# Patient Record
Sex: Male | Born: 1985 | Hispanic: Yes | Smoking: Never smoker
Health system: Southern US, Community
[De-identification: ages and names within clinical notes are randomized; demographics above are authoritative.]

## PROBLEM LIST (undated history)

## (undated) DIAGNOSIS — T7840XA Allergy, unspecified, initial encounter: Secondary | ICD-10-CM

## (undated) DIAGNOSIS — E785 Hyperlipidemia, unspecified: Secondary | ICD-10-CM

## (undated) DIAGNOSIS — J45909 Unspecified asthma, uncomplicated: Secondary | ICD-10-CM

## (undated) HISTORY — DX: Allergy, unspecified, initial encounter: T78.40XA

## (undated) HISTORY — DX: Hyperlipidemia, unspecified: E78.5

---

## 2009-07-15 ENCOUNTER — Ambulatory Visit: Payer: Self-pay | Admitting: Family Medicine

## 2011-09-24 ENCOUNTER — Emergency Department (HOSPITAL_COMMUNITY)
Admission: EM | Admit: 2011-09-24 | Discharge: 2011-09-24 | Disposition: A | Discharge status: Home / Self Care | Payer: Self-pay | Attending: Emergency Medicine | Admitting: Emergency Medicine

## 2011-09-24 ENCOUNTER — Encounter (HOSPITAL_COMMUNITY): Payer: Self-pay | Admitting: Emergency Medicine

## 2011-09-24 DIAGNOSIS — T7840XA Allergy, unspecified, initial encounter: Secondary | ICD-10-CM

## 2011-09-24 DIAGNOSIS — R21 Rash and other nonspecific skin eruption: Secondary | ICD-10-CM | POA: Insufficient documentation

## 2011-09-24 DIAGNOSIS — R062 Wheezing: Secondary | ICD-10-CM | POA: Insufficient documentation

## 2011-09-24 DIAGNOSIS — T50995A Adverse effect of other drugs, medicaments and biological substances, initial encounter: Secondary | ICD-10-CM | POA: Insufficient documentation

## 2011-09-24 HISTORY — DX: Unspecified asthma, uncomplicated: J45.909

## 2011-09-24 MED ORDER — DIPHENHYDRAMINE HCL 25 MG PO CAPS: 50.0000 mg | ORAL_CAPSULE | Freq: Once | ORAL | Status: DC

## 2011-09-24 MED ORDER — EPINEPHRINE 0.3 MG/0.3ML IJ DEVI: INTRAMUSCULAR | Status: DC

## 2011-09-24 MED ORDER — PREDNISONE 20 MG PO TABS
ORAL_TABLET | ORAL | Status: AC
  Administered 2011-09-24: 60 mg via ORAL
  Filled 2011-09-24: qty 3

## 2011-09-24 MED ORDER — DIPHENHYDRAMINE HCL 25 MG PO CAPS
50.0000 mg | ORAL_CAPSULE | Freq: Once | ORAL | Status: AC
  Administered 2011-09-24: 50 mg via ORAL

## 2011-09-24 MED ORDER — PREDNISONE 20 MG PO TABS: 40.0000 mg | ORAL_TABLET | Freq: Every day | ORAL | Status: AC

## 2011-09-24 MED ORDER — PREDNISONE 20 MG PO TABS: 60.0000 mg | ORAL_TABLET | Freq: Once | ORAL | Status: DC

## 2011-09-24 MED ORDER — LIDOCAINE VISCOUS 2 % MT SOLN: 20.0000 mL | OROMUCOSAL | Status: AC | PRN

## 2011-09-24 MED ORDER — DIPHENHYDRAMINE HCL 25 MG PO CAPS
ORAL_CAPSULE | ORAL | Status: AC
  Administered 2011-09-24: 50 mg via ORAL
  Filled 2011-09-24: qty 2

## 2011-09-24 MED ORDER — BENADRYL 25 MG PO TABS: 25.0000 mg | ORAL_TABLET | Freq: Four times a day (QID) | ORAL | Status: DC | PRN

## 2011-09-24 NOTE — ED Provider Notes (Signed)
History     CSN: [id number]  Arrival date & time 09/24/11  4098   First MD Initiated Contact with Patient 09/24/11 1942      Chief Complaint  Patient presents with  . Allergic Reaction    (Consider location/radiation/quality/duration/timing/severity/associated sxs/prior treatment) Patient is a 26 y.o. male presenting with allergic reaction. The history is provided by the patient. No language interpreter was used.  Allergic Reaction The primary symptoms are  wheezing and rash. The primary symptoms do not include shortness of breath, nausea, vomiting, dizziness or angioedema. The current episode started less than 1 hour ago. The problem has been gradually improving.  The rash is associated with itching.  The onset of the reaction was associated with a change in medication (alkasezer cold ? with asa). Significant symptoms also include rhinorrhea and itching.   Patient took alka seltzer cold pta and had an allergic reaction.  Pt is allergic to asa.   Wheezing with red raised rash to trunk.    Feels like throat is closing.  Able to swallow.  Congested x 1 week.  Ulcerations noted to uvula and back of throat.  Used his albuterol inhaler and wheezing has resolved.  Itching.   Past Medical History  Diagnosis Date  . Asthma     History reviewed. No pertinent past surgical history.  History reviewed. No pertinent family history.  History  Substance Use Topics  . Smoking status: Never Smoker   . Smokeless tobacco: Not on file  . Alcohol Use: Yes     3-4 times per week one or two drinks      Review of Systems  Constitutional: Negative.  Negative for fever.  HENT: Positive for congestion and rhinorrhea.   Eyes: Negative.   Respiratory: Positive for wheezing. Negative for shortness of breath.   Cardiovascular: Negative.  Negative for chest pain.  Gastrointestinal: Negative.  Negative for nausea and vomiting.  Skin: Positive for itching and rash.  Neurological: Negative.  Negative  for dizziness, weakness and headaches.  Psychiatric/Behavioral: Negative.   All other systems reviewed and are negative.    Allergies  Aspirin  Home Medications   Current Outpatient Rx  Name Route Sig Dispense Refill  . IPRATROPIUM-ALBUTEROL 18-103 MCG/ACT IN AERO Inhalation Inhale 2 puffs into the lungs every 6 (six) hours as needed. Shortness of breath      BP 109/68  Pulse 85  Temp 98 F (36.7 C) (Oral)  Resp 18  SpO2 99%  Physical Exam  Nursing note and vitals reviewed. Constitutional: He is oriented to person, place, and time. He appears well-developed and well-nourished.  HENT:  Head: Normocephalic.  Eyes: Conjunctivae and EOM are normal. Pupils are equal, round, and reactive to light.  Neck: Normal range of motion. Neck supple.  Cardiovascular: Normal rate.   Pulmonary/Chest: Effort normal.  Abdominal: Soft.  Musculoskeletal: Normal range of motion.  Neurological: He is alert and oriented to person, place, and time.  Skin: Skin is warm and dry. Rash noted.       Red raised macular rash to trunk  Psychiatric: He has a normal mood and affect.    ED Course  Procedures (including critical care time)  Labs Reviewed - No data to display No results found.   No diagnosis found.    MDM  Allergic reaction to alka  selzer cold and cough.  Wheezing and rash resolved in ER after inhaler, prednisone and benadryl.  Continue prednisone and benadryl/inhaler.  Return if worse.  Remi Haggard, NP 09/26/11 2001

## 2011-09-24 NOTE — ED Notes (Signed)
Per pt: sore throat and  Congestion for one week with ulceration noted to uvula and throat. Pt took Alkalizer Cold and Cough and now has facial swelling, throat swelling and diffuse itching. Pt took a cough drop, and Albuterol inhaler. Symptoms have improved since and pt no longer feels constriction in throat. No oral swelling noted. NKDA. PMH: asthma

## 2011-09-24 NOTE — ED Notes (Signed)
NP at bedside.

## 2011-09-24 NOTE — ED Notes (Signed)
Pt verbalizes understanding 

## 2011-09-28 NOTE — ED Provider Notes (Signed)
History/physical exam/procedure(s) were performed by non-physician practitioner and as supervising physician I was immediately available for consultation/collaboration. I have reviewed all notes and am in agreement with care and plan.   Hilario Quarry, MD 09/28/11 [phone]

## 2019-10-31 ENCOUNTER — Ambulatory Visit: Payer: 59 | Admitting: Medical

## 2019-10-31 ENCOUNTER — Other Ambulatory Visit: Payer: Self-pay

## 2019-10-31 ENCOUNTER — Encounter: Payer: Self-pay | Admitting: Medical

## 2019-10-31 VITALS — BP 102/80 | HR 76 | Ht 69.0 in | Wt 169.6 lb

## 2019-10-31 DIAGNOSIS — M6289 Other specified disorders of muscle: Secondary | ICD-10-CM | POA: Insufficient documentation

## 2019-10-31 DIAGNOSIS — M5441 Lumbago with sciatica, right side: Secondary | ICD-10-CM

## 2019-10-31 DIAGNOSIS — G479 Sleep disorder, unspecified: Secondary | ICD-10-CM | POA: Insufficient documentation

## 2019-10-31 DIAGNOSIS — H938X3 Other specified disorders of ear, bilateral: Secondary | ICD-10-CM

## 2019-10-31 DIAGNOSIS — M5442 Lumbago with sciatica, left side: Secondary | ICD-10-CM

## 2019-10-31 DIAGNOSIS — N5082 Scrotal pain: Secondary | ICD-10-CM

## 2019-10-31 DIAGNOSIS — J4530 Mild persistent asthma, uncomplicated: Secondary | ICD-10-CM

## 2019-10-31 DIAGNOSIS — G8929 Other chronic pain: Secondary | ICD-10-CM

## 2019-10-31 DIAGNOSIS — G4700 Insomnia, unspecified: Secondary | ICD-10-CM | POA: Insufficient documentation

## 2019-10-31 HISTORY — DX: Lumbago with sciatica, left side: M54.42

## 2019-10-31 HISTORY — DX: Other chronic pain: G89.29

## 2019-10-31 LAB — POCT URINALYSIS DIP (PROADVANTAGE DEVICE)
Bilirubin, UA: NEGATIVE
Blood, UA: NEGATIVE
Glucose, UA: NEGATIVE mg/dL
Ketones, POC UA: NEGATIVE mg/dL
Leukocytes, UA: NEGATIVE
Nitrite, UA: NEGATIVE
Protein Ur, POC: NEGATIVE mg/dL
Specific Gravity, Urine: 1.025
Urobilinogen, Ur: NEGATIVE
pH, UA: 6 (ref 5.0–8.0)

## 2019-10-31 MED ORDER — ALBUTEROL SULFATE HFA 108 (90 BASE) MCG/ACT IN AERS: 2.0000 | INHALATION_SPRAY | Freq: Four times a day (QID) | RESPIRATORY_TRACT | 1 refills | Status: DC | PRN

## 2019-10-31 NOTE — Progress Notes (Signed)
Subjective: Chief Complaint  Patient presents with  . New Patient (Initial Visit)    get established   . Anxiety    can't sleep without benadryl  . Back Pain    lower back-mostly left sdie possibly tied to scrotum   . Referral    ENT for ear problem (right)  . Hernia   Has insurance now, needs PCP.   accompanied by fiance.  Has had asthma all life, needs refill on inhaler.   Has asthma triggers year round, dust, pet dander, bursts of exercise.  Doesn't always get flare up with exercise.  Lately uses inhaler weekly once per week, some months doesn't have to use the inhaler, but uses often enough.  Sometimes uses with exercise  Currently is taking benadryl nightly more for sleep but also sometime for allergies. Has been on preventative in remote past and ddidn't think it helped.   Has dogs.  Works around some dust, Air traffic controller.   Doesn't smoke.  Has sleep problems.   Uses benadryl sometimes, sometimes melatonin.   Has rest less sleep.  Has trouble getting to sleep initially.   Attributes to anxiety or restlessness.  Tries to read to get to sleep.  Tries stretching.  This has been a problem over a year.    Works full time and has part time work.  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  Use to be worse in 20s, had shingles one time with stress.  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  Got sick often, had sores in mouth.  No prior counseling.   No prior medication.  Has tried clonopin before from a friend.  That helped chilled him out.  Engaged, has baby on the way.   Been together 1.5 years with fiance.   Biggest stressor is finances.    Has low back pain, pain in scrotum.   Has soreness in testicles, pulsing type pain.   Has had pain for few years.  He also notes low back pain, tightness in his legs and the IT band and hamstrings.  He tries to do some stretches.  He does exercise.  Pain in scrotum has been going on for more than a year.  Denies blood in urine,  no dysuria, no urinary frequency.  No numbness or tingling down the legs.  No fall.  No incontinence.  No fever.  He notes in the past 2 weeks he has had some pressure in his ears.  He notes some ringing.  He wondered about water down in the ears or wax.  He tried some home wax remedy.  Past Medical History:  Diagnosis Date  . Asthma     Current Outpatient Medications on File Prior to Visit  Medication Sig Dispense Refill  . BENADRYL 25 MG tablet Take 1 tablet (25 mg total) by mouth every 6 (six) hours as needed for itching. 20 tablet 0   No current facility-administered medications on file prior to visit.   ROS as in subjective    Objective: BP 102/80   Pulse 76   Ht 5\' 9"  (1.753 m)   Wt 169 lb 9.6 oz (76.9 kg)   SpO2 97%   BMI 25.05 kg/m   General appearence: alert, no distress, WD/WN, white male HEENT: normocephalic, sclerae anicteric, PERRLA, EOMi, TMs pearly, canals normal Heart: RRR, normal S1, S2, no murmurs Lungs: CTA bilaterally, no wheezes, rhonchi, or rales Back: non tender, full ROM  Musculoskeletal: Bilateral IT  bands and hamstrings seem tight, legs nontender, but hip range of motion and leg extension seems a little decreased due to the leg tightness, otherwise legs nontender, no swelling, no obvious deformity Extremities: no edema, no cyanosis, no clubbing Pulses: 2+ symmetric, upper and lower extremities, normal cap refill Neurological: Legs neurovascularly intact  psychiatric: normal affect, behavior normal, pleasant    Assessment: Encounter Diagnoses  Name Primary?  . Mild persistent asthma without complication Yes  . Scrotal pain   . Chronic bilateral low back pain with bilateral sciatica   . Hamstring tightness   . Sleep disturbance   . Insomnia, unspecified type   . Pressure sensation in both ears      Plan: Asthma-continue albuterol as needed, discussed prevention of flareups, discussed allergy medicine and preventative inhalers if he has  more moderate asthma symptoms.  For now he will continue albuterol as needed  Back pain, scrotal pain-etiology unclear but likely lumbar radicular issue.  We will send for baseline x-ray.  We discussed importance of testicular cancer screening but no obvious nodule today.  Urinalysis reviewed.  Continue stretching.  We will likely refer to physical therapy given the leg hamstring and IT band tightness.  Sleep disturbance, insomnia-likely triggered by stress, discussed sleep hygiene, stress reduction, consider counseling, consider journaling, can use melatonin or Benadryl as needed.  Pressure sensation ears-advised 5 days of Sudafed over-the-counter and if this does not help, then follow-up to recheck   David Cobb was seen today for new patient (initial visit), anxiety, back pain, referral and hernia.  Diagnoses and all orders for this visit:  Mild persistent asthma without complication  Scrotal pain -     DG Lumbar Spine Complete; Future -     POCT Urinalysis DIP (Proadvantage Device)  Chronic bilateral low back pain with bilateral sciatica -     DG Lumbar Spine Complete; Future  Hamstring tightness -     DG Lumbar Spine Complete; Future  Sleep disturbance  Insomnia, unspecified type  Pressure sensation in both ears  Other orders -     albuterol (VENTOLIN HFA) 108 (90 Base) MCG/ACT inhaler; Inhale 2 puffs into the lungs every 6 (six) hours as needed for wheezing or shortness of breath.  Follow-up soon for physical and fasting labs, PFT

## 2019-10-31 NOTE — Patient Instructions (Signed)
Please go to [facility] for your back xray.   Their hours are 8am - 4:30 pm Monday - Friday.  Take your insurance card with you.  [facility] [id number]  [address], [address] [place], [place] [address]  [address] [place], [place] [address]     We will likely refer you for physical therapy      Insomnia Insomnia is a sleep disorder that makes it difficult to fall asleep or stay asleep. Insomnia can cause fatigue, low energy, difficulty concentrating, mood swings, and poor performance at work or school. There are three different ways to classify insomnia:  Difficulty falling asleep.  Difficulty staying asleep.  Waking up too early in the morning. Any type of insomnia can be long-term (chronic) or short-term (acute). Both are common. Short-term insomnia usually lasts for three months or less. Chronic insomnia occurs at least three times a week for longer than three months. What are the causes? Insomnia may be caused by another condition, situation, or substance, such as:  Anxiety.  Certain medicines.  Gastroesophageal reflux disease (GERD) or other gastrointestinal conditions.  Asthma or other breathing conditions.  Restless legs syndrome, sleep apnea, or other sleep disorders.  Chronic pain.  Menopause.  Stroke.  Abuse of alcohol, tobacco, or illegal drugs.  Mental health conditions, such as depression.  Caffeine.  Neurological disorders, such as Alzheimer's disease.  An overactive thyroid (hyperthyroidism). Sometimes, the cause of insomnia may not be known. What increases the risk? Risk factors for insomnia include:  Gender. Women are affected more often than men.  Age. Insomnia is more common as you get older.  Stress.  Lack of exercise.  Irregular work schedule or working night shifts.  Traveling between different time zones.  Certain medical and mental health conditions. What are the signs or symptoms? If you have  insomnia, the main symptom is having trouble falling asleep or having trouble staying asleep. This may lead to other symptoms, such as:  Feeling fatigued or having low energy.  Feeling nervous about going to sleep.  Not feeling rested in the morning.  Having trouble concentrating.  Feeling irritable, anxious, or depressed. How is this diagnosed? This condition may be diagnosed based on:  Your symptoms and medical history. Your health care provider may ask about: ? Your sleep habits. ? Any medical conditions you have. ? Your mental health.  A physical exam. How is this treated? Treatment for insomnia depends on the cause. Treatment may focus on treating an underlying condition that is causing insomnia. Treatment may also include:  Medicines to help you sleep.  Counseling or therapy.  Lifestyle adjustments to help you sleep better. Follow these instructions at home: Eating and drinking   Limit or avoid alcohol, caffeinated beverages, and cigarettes, especially close to bedtime. These can disrupt your sleep.  Do not eat a large meal or eat spicy foods right before bedtime. This can lead to digestive discomfort that can make it hard for you to sleep. Sleep habits   Keep a sleep diary to help you and your health care provider figure out what could be causing your insomnia. Write down: ? When you sleep. ? When you wake up during the night. ? How well you sleep. ? How rested you feel the next day. ? Any side effects of medicines you are taking. ? What you eat and drink.  Make your bedroom a dark, comfortable place where it is easy to fall asleep. ? Put up shades or blackout  curtains to block light from outside. ? Use a white noise machine to block noise. ? Keep the temperature cool.  Limit screen use before bedtime. This includes: ? Watching TV. ? Using your smartphone, tablet, or computer.  Stick to a routine that includes going to bed and waking up at the same times  every day and night. This can help you fall asleep faster. Consider making a quiet activity, such as reading, part of your nighttime routine.  Try to avoid taking naps during the day so that you sleep better at night.  Get out of bed if you are still awake after 15 minutes of trying to sleep. Keep the lights down, but try reading or doing a quiet activity. When you feel sleepy, go back to bed. General instructions  Take over-the-counter and prescription medicines only as told by your health care provider.  Exercise regularly, as told by your health care provider. Avoid exercise starting several hours before bedtime.  Use relaxation techniques to manage stress. Ask your health care provider to suggest some techniques that may work well for you. These may include: ? Breathing exercises. ? Routines to release muscle tension. ? Visualizing peaceful scenes.  Make sure that you drive carefully. Avoid driving if you feel very sleepy.  Keep all follow-up visits as told by your health care provider. This is important. Contact a health care provider if:  You are tired throughout the day.  You have trouble in your daily routine due to sleepiness.  You continue to have sleep problems, or your sleep problems get worse. Get help right away if:  You have serious thoughts about hurting yourself or someone else. If you ever feel like you may hurt yourself or others, or have thoughts about taking your own life, get help right away. You can go to your nearest emergency department or call:  Your local emergency services (911 in the [place]  A suicide crisis helpline, such as the National Suicide Prevention Lifeline at 7865440831. This is open 24 hours a day. Summary  Insomnia is a sleep disorder that makes it difficult to fall asleep or stay asleep.  Insomnia can be long-term (chronic) or short-term (acute).  Treatment for insomnia depends on the cause. Treatment may focus on treating an  underlying condition that is causing insomnia.  Keep a sleep diary to help you and your health care provider figure out what could be causing your insomnia. This information is not intended to replace advice given to you by your health care provider. Make sure you discuss any questions you have with your health care provider. Document Revised: 02/03/2017 Document Reviewed: 12/01/2016 Elsevier Patient Education  2020 [employer].

## 2019-11-06 HISTORY — PX: NO PAST SURGERIES: SHX2092

## 2019-11-12 ENCOUNTER — Ambulatory Visit
Admission: RE | Admit: 2019-11-12 | Discharge: 2019-11-12 | Disposition: A | Discharge status: Home / Self Care | Payer: 59 | Source: Ambulatory Visit | Attending: Medical | Admitting: Medical

## 2019-11-12 ENCOUNTER — Telehealth: Payer: Self-pay | Admitting: Medical

## 2019-11-12 DIAGNOSIS — N5082 Scrotal pain: Secondary | ICD-10-CM

## 2019-11-12 DIAGNOSIS — M6289 Other specified disorders of muscle: Secondary | ICD-10-CM

## 2019-11-12 DIAGNOSIS — M5442 Lumbago with sciatica, left side: Secondary | ICD-10-CM

## 2019-11-12 NOTE — Telephone Encounter (Signed)
Pt dropped off form to be signed for insurance. Please call when ready  [phone]  He needs to send back to his insurance  Form being sent back in folder

## 2019-11-13 ENCOUNTER — Other Ambulatory Visit: Payer: Self-pay

## 2019-11-13 DIAGNOSIS — M545 Low back pain, unspecified: Secondary | ICD-10-CM

## 2019-11-13 NOTE — Telephone Encounter (Signed)
I completed form.  Please make a copy for [place].  Return to patient.  Keep in mind this type of form we normally do with a physical.  I just met him recently to establish care.  I do recommend scheduling him for a fasting physical

## 2019-11-15 NOTE — Telephone Encounter (Signed)
Wife came by office and picked up "copy" of form

## 2019-11-19 NOTE — Telephone Encounter (Signed)
Appt made for CPE

## 2019-11-26 ENCOUNTER — Other Ambulatory Visit: Payer: Self-pay

## 2019-11-26 ENCOUNTER — Encounter: Payer: Self-pay | Admitting: Medical

## 2019-11-26 ENCOUNTER — Ambulatory Visit: Payer: 59 | Admitting: Medical

## 2019-11-26 ENCOUNTER — Telehealth: Payer: Self-pay

## 2019-11-26 VITALS — BP 110/64 | HR 64 | Ht 71.5 in | Wt 169.5 lb

## 2019-11-26 DIAGNOSIS — Z889 Allergy status to unspecified drugs, medicaments and biological substances status: Secondary | ICD-10-CM

## 2019-11-26 DIAGNOSIS — G479 Sleep disorder, unspecified: Secondary | ICD-10-CM

## 2019-11-26 DIAGNOSIS — Z7189 Other specified counseling: Secondary | ICD-10-CM

## 2019-11-26 DIAGNOSIS — R1900 Intra-abdominal and pelvic swelling, mass and lump, unspecified site: Secondary | ICD-10-CM | POA: Insufficient documentation

## 2019-11-26 DIAGNOSIS — Z0000 Encounter for general adult medical examination without abnormal findings: Secondary | ICD-10-CM | POA: Diagnosis not present

## 2019-11-26 DIAGNOSIS — Z7185 Encounter for immunization safety counseling: Secondary | ICD-10-CM | POA: Insufficient documentation

## 2019-11-26 DIAGNOSIS — N5082 Scrotal pain: Secondary | ICD-10-CM

## 2019-11-26 DIAGNOSIS — R198 Other specified symptoms and signs involving the digestive system and abdomen: Secondary | ICD-10-CM

## 2019-11-26 DIAGNOSIS — G4700 Insomnia, unspecified: Secondary | ICD-10-CM

## 2019-11-26 DIAGNOSIS — Z113 Encounter for screening for infections with a predominantly sexual mode of transmission: Secondary | ICD-10-CM

## 2019-11-26 DIAGNOSIS — Z13220 Encounter for screening for lipoid disorders: Secondary | ICD-10-CM | POA: Insufficient documentation

## 2019-11-26 DIAGNOSIS — Z1321 Encounter for screening for nutritional disorder: Secondary | ICD-10-CM

## 2019-11-26 DIAGNOSIS — K121 Other forms of stomatitis: Secondary | ICD-10-CM | POA: Diagnosis not present

## 2019-11-26 DIAGNOSIS — Z7689 Persons encountering health services in other specified circumstances: Secondary | ICD-10-CM | POA: Insufficient documentation

## 2019-11-26 MED ORDER — TRIAMCINOLONE ACETONIDE 0.1 % MT PSTE: 1.0000 "application " | PASTE | Freq: Two times a day (BID) | OROMUCOSAL | 0 refills | Status: DC

## 2019-11-26 MED ORDER — TRAZODONE HCL 50 MG PO TABS: 25.0000 mg | ORAL_TABLET | Freq: Every evening | ORAL | 2 refills | Status: DC | PRN

## 2019-11-26 NOTE — Telephone Encounter (Signed)
done

## 2019-11-26 NOTE — Telephone Encounter (Signed)
Pt. Called stating he just left here had a CPE with you at 12. He said you guys discussed calling in some kind of gel for mouth ulcers he wanted to know if it had been sent in yet. I told him the only thing I saw had been sent on was Trazodone. He wanted to know if you could send that in to the same pharmacy as the Trazodone please.

## 2019-11-26 NOTE — Progress Notes (Signed)
Subjective:   HPI  David Cobb is a 34 y.o. male who presents for Chief Complaint  Patient presents with   cpe    CPE, Hernia, and blisters in mouth on throat, and side of mouth, has had this off and on for 10 plus years. having trouble sleeping    Patient Care Team: Lemond Griffee, Cleda Mccreedy as PCP - General (Family Medicine) Sees dentist Sees eye doctor  Concerns: Here with girlfriend who is expecting their first child in November  I saw him recently for other concerns in August  The scrotal pain he was having has resolved  He still wants to talk about insomnia and sleep problems.  He tried some things we discussed last visit.  He wants to try a medicine that might work better for keeping him asleep  He wonders about a hernia or fullness in his upper abdomen, concerned about hernia versus mass.  He denies any major reflux issues.  No belly pain or bowel issues  He gets blisters inside of his mouth/ulcers on average once per month.  This has been going on few years.  Does not get cold sores  Reviewed their medical, surgical, family, social, medication, and allergy history and updated chart as appropriate.  Past Medical History:  Diagnosis Date   Allergy    Asthma     Past Surgical History:  Procedure Laterality Date   NO PAST SURGERIES  11/2019    Family History  Problem Relation Age of Onset   Healthy Mother    Asthma Father    Healthy Sister    Asthma Brother    Diabetes Paternal Actor    Healthy Brother    Healthy Brother    Healthy Sister    Healthy Sister    Cancer Neg Hx    Stroke Neg Hx    Heart disease Neg Hx      Current Outpatient Medications:    albuterol (VENTOLIN HFA) 108 (90 Base) MCG/ACT inhaler, Inhale 2 puffs into the lungs every 6 (six) hours as needed for wheezing or shortness of breath., Disp: 18 g, Rfl: 1   BENADRYL 25 MG tablet, Take 1 tablet (25 mg total) by mouth every 6 (six) hours as needed for itching.,  Disp: 20 tablet, Rfl: 0   traZODone (DESYREL) 50 MG tablet, Take 0.5-1 tablets (25-50 mg total) by mouth at bedtime as needed for sleep., Disp: 30 tablet, Rfl: 2   triamcinolone (KENALOG) 0.1 % paste, Use as directed 1 application in the mouth or throat 2 (two) times daily., Disp: 5 g, Rfl: 0  Allergies  Allergen Reactions   Aspirin Anaphylaxis   Ibuprofen        Review of Systems Constitutional: -fever, -chills, -sweats, -unexpected weight change, -decreased appetite, -fatigue Allergy: -sneezing, -itching, -congestion Dermatology: -changing moles, --rash, -lumps ENT: -runny nose, -ear pain, -sore throat, -hoarseness, -sinus pain, -teeth pain, - ringing in ears, -hearing loss, -nosebleeds Cardiology: -chest pain, -palpitations, -swelling, -difficulty breathing when lying flat, -waking up short of breath Respiratory: -cough, -shortness of breath, -difficulty breathing with exercise or exertion, -wheezing, -coughing up blood Gastroenterology: -abdominal pain, -nausea, -vomiting, -diarrhea, -constipation, -blood in stool, -changes in bowel movement, -difficulty swallowing or eating Hematology: -bleeding, -bruising  Musculoskeletal: -joint aches, -muscle aches, -joint swelling, -back pain, -neck pain, -cramping, -changes in gait Ophthalmology: denies vision changes, eye redness, itching, discharge Urology: -burning with urination, -difficulty urinating, -blood in urine, -urinary frequency, -urgency, -incontinence Neurology: -headache, -weakness, -tingling, -numbness, -memory loss, -falls, -dizziness  Psychology: -depressed mood, -agitation Male GU: no testicular mass, pain, no lymph nodes swollen, no swelling, no rash.     Objective:  BP 110/64    Pulse 64    Ht 5' 11.5" (1.816 m)    Wt 169 lb 8 oz (76.9 kg)    BMI 23.31 kg/m   General appearance: alert, no distress, WD/WN, Caucasian male Skin: Unremarkable, no worrisome lesions HEENT: normocephalic, conjunctiva/corneas normal,  sclerae anicteric, PERRLA, EOMi, nares patent, no discharge or erythema, pharynx normal Oral cavity: MMM, tongue normal, there is a small 3 mm ulcer on the right lateral tongue, there is a similar 4 mm diameter ulcer of the right buccal mucosa, otherwise tongue and teeth normal Neck: supple, no lymphadenopathy, no thyromegaly, no masses, normal ROM, no bruits Chest: non tender, normal shape and expansion, prominent projection of the xiphoid process Heart: RRR, normal S1, S2, no murmurs Lungs: CTA bilaterally, no wheezes, rhonchi, or rales Abdomen: +bs, soft, non tender, non distended, no masses, no hepatomegaly, no splenomegaly, no bruits Back: non tender, normal ROM, no scoliosis Musculoskeletal: upper extremities non tender, no obvious deformity, normal ROM throughout, lower extremities non tender, no obvious deformity, normal ROM throughout Extremities: no edema, no cyanosis, no clubbing Pulses: 2+ symmetric, upper and lower extremities, normal cap refill Neurological: alert, oriented x 3, CN2-12 intact, strength normal upper extremities and lower extremities, sensation normal throughout, DTRs 2+ throughout, no cerebellar signs, gait normal Psychiatric: normal affect, behavior normal, pleasant  GU: Examined last visit within the past month, deferred today Rectal: Declined/deferred    Assessment and Plan :   Encounter Diagnoses  Name Primary?   Encounter for health maintenance examination in adult Yes   Mouth ulcer    Insomnia, unspecified type    Sleep disturbance    Abdominal fullness    Screening for lipid disorders    Encounter for vitamin deficiency screening    Screen for STD (sexually transmitted disease)    History of allergy    Vaccine counseling    Scrotal pain     Physical exam - discussed and counseled on healthy lifestyle, diet, exercise, preventative care, vaccinations, sick and well care, proper use of emergency dept and after hours care, and addressed  their concerns.    Health screening: See your eye doctor yearly for routine vision care. See your dentist yearly for routine dental care including hygiene visits twice yearly.  Cancer screening Advised monthly self testicular exam   Vaccinations: He got his first Covid shot about 3 weeks ago.  He has a second shot coming up in 1 week.  I recommended he have flu shot when he gets his second Covid shot in the next week  I recommended he return mid-October 2 weeks out from the Covid shot for Tdap given his upcoming birth of his baby in November   Separate significant issues discussed: Mouth ulcers-discussed possible causes.  Labs today.  Consider other specialist eval if needed  Abdominal fullness-we discussed the lump feeling in his upper abdomen which seems to be the xiphoid process but he feels like there is something else there.  We discussed possibly getting a chest x-ray or CT abdomen for further evaluation.  He will let me know  Insomnia, sleep disturbance-advised counseling to do CBT.  In the meantime begin trial of trazodone.  Discussed risk and benefits of medicine, proper use of medicine  Scrotal pain-resolved  Earland was seen today for cpe.  Diagnoses and all orders for this visit:  Encounter for health maintenance examination in adult -     Comprehensive metabolic panel -     CBC with Differential/Platelet -     Sedimentation rate -     Vitamin B12 -     Folate -     VITAMIN D 25 Hydroxy (Vit-D Deficiency, Fractures) -     Lipid panel -     HIV Antibody (routine testing w rflx) -     RPR -     GC/Chlamydia Probe Amp -     Hepatitis C antibody  Mouth ulcer -     Sedimentation rate -     Vitamin B12 -     Folate -     VITAMIN D 25 Hydroxy (Vit-D Deficiency, Fractures)  Insomnia, unspecified type  Sleep disturbance  Abdominal fullness  Screening for lipid disorders -     Lipid panel  Encounter for vitamin deficiency screening  Screen for STD (sexually  transmitted disease) -     HIV Antibody (routine testing w rflx) -     RPR -     GC/Chlamydia Probe Amp -     Hepatitis C antibody  History of allergy  Vaccine counseling  Scrotal pain  Other orders -     traZODone (DESYREL) 50 MG tablet; Take 0.5-1 tablets (25-50 mg total) by mouth at bedtime as needed for sleep. -     triamcinolone (KENALOG) 0.1 % paste; Use as directed 1 application in the mouth or throat 2 (two) times daily.    Follow-up pending labs, yearly for physical

## 2019-11-27 LAB — COMPREHENSIVE METABOLIC PANEL
ALT: 24 IU/L (ref 0–44)
AST: 18 IU/L (ref 0–40)
Albumin/Globulin Ratio: 1.9 (ref 1.2–2.2)
Albumin: 5 g/dL (ref 4.0–5.0)
Alkaline Phosphatase: 98 IU/L (ref 44–121)
BUN/Creatinine Ratio: 18 (ref 9–20)
BUN: 15 mg/dL (ref 6–20)
Bilirubin Total: 0.4 mg/dL (ref 0.0–1.2)
CO2: 25 mmol/L (ref 20–29)
Calcium: 9.5 mg/dL (ref 8.7–10.2)
Chloride: 100 mmol/L (ref 96–106)
Creatinine, Ser: 0.85 mg/dL (ref 0.76–1.27)
GFR calc Af Amer: 131 mL/min/{1.73_m2} (ref >59)
GFR calc non Af Amer: 113 mL/min/{1.73_m2} (ref >59)
Globulin, Total: 2.6 g/dL (ref 1.5–4.5)
Glucose: 99 mg/dL (ref 65–99)
Potassium: 5 mmol/L (ref 3.5–5.2)
Sodium: 137 mmol/L (ref 134–144)
Total Protein: 7.6 g/dL (ref 6.0–8.5)

## 2019-11-27 LAB — VITAMIN D 25 HYDROXY (VIT D DEFICIENCY, FRACTURES): Vit D, 25-Hydroxy: 41.1 ng/mL (ref 30.0–100.0)

## 2019-11-27 LAB — CBC WITH DIFFERENTIAL/PLATELET
Basophils Absolute: 0.1 10*3/uL (ref 0.0–0.2)
Basos: 1 %
EOS (ABSOLUTE): 0.3 10*3/uL (ref 0.0–0.4)
Eos: 6 %
Hematocrit: 47.7 % (ref 37.5–51.0)
Hemoglobin: 16 g/dL (ref 13.0–17.7)
Immature Grans (Abs): 0 10*3/uL (ref 0.0–0.1)
Immature Granulocytes: 0 %
Lymphocytes Absolute: 1.7 10*3/uL (ref 0.7–3.1)
Lymphs: 40 %
MCH: 28.7 pg (ref 26.6–33.0)
MCHC: 33.5 g/dL (ref 31.5–35.7)
MCV: 86 fL (ref 79–97)
Monocytes Absolute: 0.4 10*3/uL (ref 0.1–0.9)
Monocytes: 9 %
Neutrophils Absolute: 1.9 10*3/uL (ref 1.4–7.0)
Neutrophils: 44 %
Platelets: 275 10*3/uL (ref 150–450)
RBC: 5.57 x10E6/uL (ref 4.14–5.80)
RDW: 13.2 % (ref 11.6–15.4)
WBC: 4.3 10*3/uL (ref 3.4–10.8)

## 2019-11-27 LAB — VITAMIN B12: Vitamin B-12: 440 pg/mL (ref 232–1245)

## 2019-11-27 LAB — FOLATE: Folate: >20 ng/mL (ref >3.0)

## 2019-11-27 LAB — LIPID PANEL
Chol/HDL Ratio: 5.5 ratio — ABNORMAL HIGH (ref 0.0–5.0)
Cholesterol, Total: 262 mg/dL — ABNORMAL HIGH (ref 100–199)
HDL: 48 mg/dL (ref >39)
LDL Chol Calc (NIH): 188 mg/dL — ABNORMAL HIGH (ref 0–99)
Triglycerides: 141 mg/dL (ref 0–149)
VLDL Cholesterol Cal: 26 mg/dL (ref 5–40)

## 2019-11-27 LAB — HEPATITIS C ANTIBODY: Hep C Virus Ab: <0.1 s/co ratio (ref 0.0–0.9)

## 2019-11-27 LAB — HIV ANTIBODY (ROUTINE TESTING W REFLEX): HIV Screen 4th Generation wRfx: NONREACTIVE

## 2019-11-27 LAB — RPR: RPR Ser Ql: NONREACTIVE

## 2019-11-27 LAB — SEDIMENTATION RATE: Sed Rate: 11 mm/hr (ref 0–15)

## 2019-11-28 LAB — GC/CHLAMYDIA PROBE AMP
Chlamydia trachomatis, NAA: NEGATIVE
Neisseria Gonorrhoeae by PCR: NEGATIVE

## 2019-12-03 ENCOUNTER — Telehealth: Payer: Self-pay | Admitting: Medical

## 2019-12-03 NOTE — Telephone Encounter (Signed)
Pt advised and said he will call back next week to schedule with Vincenza Hews

## 2019-12-03 NOTE — Telephone Encounter (Signed)
He can go ahead and stop the medicine and discuss further care with Vincenza Hews next week.

## 2019-12-03 NOTE — Telephone Encounter (Signed)
David Cobb's pt was prescribed Trazodone for sleep and he feels like its making his asthma flare up. He wants to know if he could get something else mild prescribed that's not addictive.

## 2019-12-17 ENCOUNTER — Ambulatory Visit (HOSPITAL_COMMUNITY): Admit: 2019-12-17 | Disposition: A | Discharge status: Home / Self Care | Payer: 59

## 2019-12-17 ENCOUNTER — Other Ambulatory Visit: Payer: Self-pay

## 2019-12-17 ENCOUNTER — Encounter: Payer: Self-pay | Admitting: Emergency Medicine

## 2019-12-17 ENCOUNTER — Emergency Department
Admission: EM | Admit: 2019-12-17 | Discharge: 2019-12-17 | Disposition: A | Discharge status: Home / Self Care | Payer: 59 | Source: Home / Self Care | Attending: Family Medicine | Admitting: Family Medicine

## 2019-12-17 ENCOUNTER — Emergency Department: Admit: 2019-12-17 | Discharge status: Absent without leave | Payer: Self-pay

## 2019-12-17 DIAGNOSIS — H15101 Unspecified episcleritis, right eye: Secondary | ICD-10-CM

## 2019-12-17 MED ORDER — OLOPATADINE HCL 0.2 % OP SOLN: 1.0000 [drp] | Freq: Every day | OPHTHALMIC | 0 refills | Status: DC

## 2019-12-17 NOTE — Discharge Instructions (Addendum)
Contact a health care provider if: Your symptoms have not gotten better in the expected amount of time. This can take days to weeks. You develop new symptoms. You develop worsening pain, photophobia, or blurred vision. Your symptoms come back after treatment.

## 2019-12-17 NOTE — ED Provider Notes (Signed)
[facility]    CSN: [id number] Arrival date & time: 12/17/19  1229      History   Chief Complaint Chief Complaint  Patient presents with  . Eye Pain    HPI David Cobb is a 34 y.o. male.   Patient complains of vague soreness/irritation in the lateral aspect of his right eye for about two days.  He denies foreign body sensation.  He denies changes in vision.  The history is provided by the patient.  Eye Problem Location:  Right eye Quality:  Dull Severity:  Mild Onset quality:  Gradual Duration:  2 days Timing:  Constant Progression:  Unchanged Chronicity:  New Context: not burn, not chemical exposure, not contact lens problem, not direct trauma, not foreign body, not using machinery, not scratch, not smoke exposure and not UV exposure   Relieved by:  None tried Worsened by:  Eye movement Ineffective treatments:  None tried Associated symptoms: redness   Associated symptoms: no blurred vision, no crusting, no decreased vision, no discharge, no double vision, no facial rash, no headaches, no inflammation, no itching, no photophobia, no scotomas, no swelling and no tearing   Risk factors: no conjunctival hemorrhage and no recent URI     Past Medical History:  Diagnosis Date  . Allergy   . Asthma     Patient Active Problem List   Diagnosis Date Noted  . Encounter for health maintenance examination in adult 11/26/2019  . Mouth ulcer 11/26/2019  . Abdominal fullness 11/26/2019  . Screening for lipid disorders 11/26/2019  . Encounter for vitamin deficiency screening 11/26/2019  . Screen for STD (sexually transmitted disease) 11/26/2019  . History of allergy 11/26/2019  . Vaccine counseling 11/26/2019  . Mild persistent asthma without complication 10/31/2019  . Scrotal pain 10/31/2019  . 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  . Hamstring tightness 10/31/2019  . Sleep disturbance 10/31/2019  . Insomnia 10/31/2019  .  Pressure sensation in both ears 10/31/2019    Past Surgical History:  Procedure Laterality Date  . NO PAST SURGERIES  11/2019       Home Medications    Prior to Admission medications   Medication Sig Start Date End Date Taking? Authorizing Provider  albuterol (VENTOLIN HFA) 108 (90 Base) MCG/ACT inhaler Inhale 2 puffs into the lungs every 6 (six) hours as needed for wheezing or shortness of breath. 10/31/19  Yes Tysinger, Kermit Balo, PA-C  traZODone (DESYREL) 50 MG tablet Take 0.5-1 tablets (25-50 mg total) by mouth at bedtime as needed for sleep. 11/26/19  Yes Tysinger, Kermit Balo, PA-C  BENADRYL 25 MG tablet Take 1 tablet (25 mg total) by mouth every 6 (six) hours as needed for itching. 09/24/11 11/26/19  Jethro Bastos, NP  Olopatadine HCl 0.2 % SOLN Place 1 drop into the right eye daily. 12/17/19   Lattie Haw, MD  triamcinolone (KENALOG) 0.1 % paste Use as directed 1 application in the mouth or throat 2 (two) times daily. 11/26/19   Tysinger, Kermit Balo, PA-C    Family History Family History  Problem Relation Age of Onset  . Healthy Mother   . Asthma Father   . Healthy Sister   . Asthma Brother   . Diabetes Paternal Grandfather   . Healthy Brother   . Healthy Brother   . Healthy Sister   . Healthy Sister   . Cancer Neg Hx   . Stroke Neg Hx   . Heart disease Neg Hx  Social History Social History   Tobacco Use  . Smoking status: Never Smoker  . Smokeless tobacco: Never Used  Vaping Use  . Vaping Use: Never used  Substance Use Topics  . Alcohol use: Yes    Comment: 3-4 times per week one or two drinks  . Drug use: Never     Allergies   Aspirin and Ibuprofen   Review of Systems Review of Systems  Eyes: Positive for redness. Negative for blurred vision, double vision, photophobia, pain, discharge, itching and visual disturbance.  Neurological: Negative for headaches.  All other systems reviewed and are negative.    Physical Exam Triage Vital Signs ED  Triage Vitals  Enc Vitals Group     BP 12/17/19 1247 123/76     Pulse Rate 12/17/19 1247 68     Resp 12/17/19 1247 15     Temp 12/17/19 1247 98 F (36.7 C)     Temp Source 12/17/19 1247 Oral     SpO2 12/17/19 1247 97 %     Weight --      Height --      Head Circumference --      Peak Flow --      Pain Score 12/17/19 1249 2     Pain Loc --      Pain Edu? --      Excl. in GC? --    No data found.  Updated Vital Signs BP 123/76 (BP Location: Right Arm)   Pulse 68   Temp 98 F (36.7 C) (Oral)   Resp 15   SpO2 97%   Visual Acuity Right Eye Distance: 20/20 (w/ glasses) Left Eye Distance: 20/20 (w/ glasses) Bilateral Distance: 20/15 (w/ glasses)  Right Eye Near:   Left Eye Near:    Bilateral Near:     Physical Exam Vitals and nursing note reviewed.  Constitutional:      General: He is not in acute distress. HENT:     Head: Normocephalic.     Right Ear: Tympanic membrane, ear canal and external ear normal.     Left Ear: Tympanic membrane, ear canal and external ear normal.     Nose: Nose normal.     Mouth/Throat:     Pharynx: Oropharynx is clear.  Eyes:     General: Lids are normal. Lids are everted, no foreign bodies appreciated. Vision grossly intact.        Right eye: No foreign body, discharge or hordeolum.     Extraocular Movements: Extraocular movements intact.     Pupils: Pupils are equal, round, and reactive to light.      Comments: Right eye reveals vasodilation of the superficial episcleral vessels laterally as noted on diagram. .  No photophobia.  No discharge present. Fundi benign. Fluorescein shows no uptake.  Cardiovascular:     Rate and Rhythm: Normal rate.  Pulmonary:     Effort: Pulmonary effort is normal.  Skin:    General: Skin is warm and dry.  Neurological:     Mental Status: He is alert and oriented to person, place, and time.      UC Treatments / Results  Labs (all labs ordered are listed, but only abnormal results are  displayed) Labs Reviewed - No data to display  EKG   Radiology No results found.  Procedures Procedures (including critical care time)  Medications Ordered in UC Medications - No data to display  Initial Impression / Assessment and Plan / UC Course  I have reviewed  the triage vital signs and the nursing notes.  Pertinent labs & imaging results that were available during my care of the patient were reviewed by me and considered in my medical decision making (see chart for details).      Begin Pataday ophthalmic suspension. Followup with ophthalmologist if not improved two weeks. Final Clinical Impressions(s) / UC Diagnoses   Final diagnoses:  Episcleritis of right eye     Discharge Instructions     Contact a health care provider if:  Your symptoms have not gotten better in the expected amount of time. This can take days to weeks.  You develop new symptoms.  You develop worsening pain, photophobia, or blurred vision.  Your symptoms come back after treatment.    ED Prescriptions    Medication Sig Dispense Auth. Provider   Olopatadine HCl 0.2 % SOLN Place 1 drop into the right eye daily. 2.5 mL Lattie Haw, MD        Lattie Haw, MD 12/20/19 [phone]

## 2019-12-17 NOTE — ED Triage Notes (Signed)
Redness & irritation to R eye in lower outer corner - swelling noted to lower eye lid Started a few days ago - no OTC meds Moderna vaccine - 2nd dose due tomorrow

## 2019-12-19 ENCOUNTER — Ambulatory Visit (HOSPITAL_COMMUNITY): Payer: 59 | Admitting: Physical Therapy

## 2020-01-23 ENCOUNTER — Telehealth: Payer: Self-pay | Admitting: Medical

## 2020-01-23 NOTE — Telephone Encounter (Signed)
Pt called and states that he needs a whooping cough shot, he is having a baby that is due next week,  He can be reached at [phone]

## 2020-01-24 NOTE — Telephone Encounter (Signed)
Left message for pt

## 2020-01-24 NOTE — Telephone Encounter (Signed)
Yes, get him in for the Tdap

## 2020-04-14 ENCOUNTER — Ambulatory Visit: Payer: 59 | Admitting: Family Medicine

## 2020-05-12 ENCOUNTER — Ambulatory Visit: Payer: 59 | Admitting: Nurse Practitioner

## 2020-05-30 ENCOUNTER — Ambulatory Visit
Admission: EM | Admit: 2020-05-30 | Discharge: 2020-05-30 | Disposition: A | Discharge status: Home / Self Care | Payer: 59 | Attending: Physician Assistant | Admitting: Physician Assistant

## 2020-05-30 ENCOUNTER — Other Ambulatory Visit: Payer: Self-pay

## 2020-05-30 ENCOUNTER — Encounter: Payer: Self-pay | Admitting: Emergency Medicine

## 2020-05-30 DIAGNOSIS — H1032 Unspecified acute conjunctivitis, left eye: Secondary | ICD-10-CM

## 2020-05-30 MED ORDER — POLYMYXIN B-TRIMETHOPRIM 10000-0.1 UNIT/ML-% OP SOLN: 1.0000 [drp] | Freq: Four times a day (QID) | OPHTHALMIC | 0 refills | Status: AC

## 2020-05-30 NOTE — ED Triage Notes (Signed)
LT eye irritation, drainage and redness that started this morning.

## 2020-05-30 NOTE — ED Provider Notes (Signed)
[facility]    CSN: [id number] Arrival date & time: 05/30/20  1006      History   Chief Complaint Chief Complaint  Patient presents with  . Eye Problem    HPI David Cobb is a 35 y.o. male.   Presents with eye crusting and irritation that began this am. No known exposures. Had "crust" in eyes, and mild swelling along the lower lid. No actual eye pain and no visual disturbances. No URI symtpoms are noted.      Past Medical History:  Diagnosis Date  . Allergy   . Asthma     Patient Active Problem List   Diagnosis Date Noted  . Encounter for health maintenance examination in adult 11/26/2019  . Mouth ulcer 11/26/2019  . Abdominal fullness 11/26/2019  . Screening for lipid disorders 11/26/2019  . Encounter for vitamin deficiency screening 11/26/2019  . Screen for STD (sexually transmitted disease) 11/26/2019  . History of allergy 11/26/2019  . Vaccine counseling 11/26/2019  . Mild persistent asthma without complication 10/31/2019  . Scrotal pain 10/31/2019  . 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  . Hamstring tightness 10/31/2019  . Sleep disturbance 10/31/2019  . Insomnia 10/31/2019  . Pressure sensation in both ears 10/31/2019    Past Surgical History:  Procedure Laterality Date  . NO PAST SURGERIES  11/2019       Home Medications    Prior to Admission medications   Medication Sig Start Date End Date Taking? Authorizing Provider  trimethoprim-polymyxin b (POLYTRIM) ophthalmic solution Place 1 drop into the left eye every 6 (six) hours for 5 days. 05/30/20 06/04/20 Yes Amiliah Campisi, Dillard Cannon, PA-C  albuterol (VENTOLIN HFA) 108 (90 Base) MCG/ACT inhaler Inhale 2 puffs into the lungs every 6 (six) hours as needed for wheezing or shortness of breath. 10/31/19   Tysinger, Kermit Balo, PA-C  BENADRYL 25 MG tablet Take 1 tablet (25 mg total) by mouth every 6 (six) hours as needed for itching. 09/24/11 11/26/19  Jethro Bastos, NP   Olopatadine HCl 0.2 % SOLN Place 1 drop into the right eye daily. 12/17/19   Lattie Haw, MD  traZODone (DESYREL) 50 MG tablet Take 0.5-1 tablets (25-50 mg total) by mouth at bedtime as needed for sleep. 11/26/19   Tysinger, Kermit Balo, PA-C  triamcinolone (KENALOG) 0.1 % paste Use as directed 1 application in the mouth or throat 2 (two) times daily. 11/26/19   Tysinger, Kermit Balo, PA-C    Family History Family History  Problem Relation Age of Onset  . Healthy Mother   . Asthma Father   . Healthy Sister   . Asthma Brother   . Diabetes Paternal Grandfather   . Healthy Brother   . Healthy Brother   . Healthy Sister   . Healthy Sister   . Cancer Neg Hx   . Stroke Neg Hx   . Heart disease Neg Hx     Social History Social History   Tobacco Use  . Smoking status: Never Smoker  . Smokeless tobacco: Never Used  Vaping Use  . Vaping Use: Never used  Substance Use Topics  . Alcohol use: Yes    Comment: 3-4 times per week one or two drinks  . Drug use: Never     Allergies   Aspirin and Ibuprofen   Review of Systems Review of Systems  Eyes: Positive for pain, discharge and itching. Negative for photophobia, redness and visual disturbance.  All other systems reviewed and  are negative.    Physical Exam Triage Vital Signs ED Triage Vitals  Enc Vitals Group     BP 05/30/20 1018 124/81     Pulse Rate 05/30/20 1018 60     Resp 05/30/20 1018 17     Temp 05/30/20 1018 98.1 F (36.7 C)     Temp Source 05/30/20 1018 Oral     SpO2 05/30/20 1018 97 %     Weight --      Height --      Head Circumference --      Peak Flow --      Pain Score 05/30/20 1017 0     Pain Loc --      Pain Edu? --      Excl. in GC? --    No data found.  Updated Vital Signs BP 124/81 (BP Location: Right Arm)   Pulse 60   Temp 98.1 F (36.7 C) (Oral)   Resp 17   SpO2 97%   Visual Acuity Right Eye Distance:   Left Eye Distance:   Bilateral Distance:    Right Eye Near:   Left Eye Near:     Bilateral Near:     Physical Exam Vitals and nursing note reviewed.  Constitutional:      General: He is not in acute distress.    Appearance: Normal appearance. He is normal weight. He is not ill-appearing.  HENT:     Head: Normocephalic and atraumatic.  Eyes:     Extraocular Movements: Extraocular movements intact.     Pupils: Pupils are equal, round, and reactive to light.     Comments: Beefy red left conjunctiva with crusting, yellow mild discharge  Cardiovascular:     Rate and Rhythm: Normal rate.  Pulmonary:     Effort: Pulmonary effort is normal.  Skin:    General: Skin is warm and dry.  Neurological:     General: No focal deficit present.     Mental Status: He is alert.  Psychiatric:        Mood and Affect: Mood normal.      UC Treatments / Results  Labs (all labs ordered are listed, but only abnormal results are displayed) Labs Reviewed - No data to display  EKG   Radiology No results found.  Procedures Procedures (including critical care time)  Medications Ordered in UC Medications - No data to display  Initial Impression / Assessment and Plan / UC Course  I have reviewed the triage vital signs and the nursing notes.  Pertinent labs & imaging results that were available during my care of the patient were reviewed by me and considered in my medical decision making (see chart for details).    Cover for bacterial conjunctivitis due to purulent discharge. FU as needed Final Clinical Impressions(s) / UC Diagnoses   Final diagnoses:  Acute bacterial conjunctivitis of left eye     Discharge Instructions     Keep eye clean, use drops every 6 hours for 3-5 days. Use 1 day longer than symptoms persist. FU as needed.    ED Prescriptions    Medication Sig Dispense Auth. Provider   trimethoprim-polymyxin b (POLYTRIM) ophthalmic solution Place 1 drop into the left eye every 6 (six) hours for 5 days. 10 mL Riki Sheer, [place]     PDMP not reviewed  this encounter.   Riki Sheer, PA-C 05/30/20 1100

## 2020-05-30 NOTE — Discharge Instructions (Addendum)
Keep eye clean, use drops every 6 hours for 3-5 days. Use 1 day longer than symptoms persist. FU as needed.

## 2020-07-21 ENCOUNTER — Ambulatory Visit: Payer: 59 | Admitting: Nurse Practitioner

## 2020-08-04 ENCOUNTER — Other Ambulatory Visit: Payer: Self-pay

## 2020-08-04 ENCOUNTER — Ambulatory Visit (INDEPENDENT_AMBULATORY_CARE_PROVIDER_SITE_OTHER): Payer: 59 | Admitting: Nurse Practitioner

## 2020-08-04 ENCOUNTER — Encounter: Payer: Self-pay | Admitting: Nurse Practitioner

## 2020-08-04 DIAGNOSIS — R1906 Epigastric swelling, mass or lump: Secondary | ICD-10-CM | POA: Diagnosis not present

## 2020-08-04 DIAGNOSIS — G4700 Insomnia, unspecified: Secondary | ICD-10-CM

## 2020-08-04 DIAGNOSIS — J4530 Mild persistent asthma, uncomplicated: Secondary | ICD-10-CM

## 2020-08-04 DIAGNOSIS — F419 Anxiety disorder, unspecified: Secondary | ICD-10-CM | POA: Insufficient documentation

## 2020-08-04 DIAGNOSIS — Z7689 Persons encountering health services in other specified circumstances: Secondary | ICD-10-CM

## 2020-08-04 MED ORDER — SERTRALINE HCL 50 MG PO TABS: 50.0000 mg | ORAL_TABLET | Freq: Every day | ORAL | 3 refills | Status: DC

## 2020-08-04 MED ORDER — ALBUTEROL SULFATE HFA 108 (90 BASE) MCG/ACT IN AERS: 2.0000 | INHALATION_SPRAY | Freq: Four times a day (QID) | RESPIRATORY_TRACT | 1 refills | Status: DC | PRN

## 2020-08-04 MED ORDER — QUETIAPINE FUMARATE 50 MG PO TABS: 50.0000 mg | ORAL_TABLET | Freq: Every day | ORAL | 1 refills | Status: DC

## 2020-08-04 NOTE — Assessment & Plan Note (Signed)
-  soft palpable mass just below his xiphoid process; likely lipoma -he is concerned and would like imaging -[place] ordered

## 2020-08-04 NOTE — Patient Instructions (Signed)
Please have fasting labs drawn 2-3 days prior to your appointment so we can discuss the results during your office visit.  

## 2020-08-04 NOTE — Progress Notes (Signed)
New Patient Office Visit  Subjective:  Patient ID: David Cobb, male    DOB: 1985/07/22  Age: 35 y.o. MRN: [id number]  CC:  Chief Complaint  Patient presents with  . New Patient (Initial Visit)    HPI David Cobb presents for new patient visit. Transferring care from [facility] in [place] off of [address]. Last physical was Sept 2021. Last labs on record are from 11/26/19.  He states that he has anxiety and he has had trouble sleeping. He trazodone 3-4 nights, and that didn't help.  He gets stress-related sores in his mouth and has tried multiple medications and mouth washes.  Past Medical History:  Diagnosis Date  . Allergy    seasonal as well as ASA and ibuprofen  . Asthma   . 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  . Hyperlipidemia     Past Surgical History:  Procedure Laterality Date  . NO PAST SURGERIES  11/2019    Family History  Problem Relation Age of Onset  . Healthy Mother   . Asthma Father   . Healthy Sister   . Asthma Brother   . Diabetes Paternal Grandfather   . Healthy Brother   . Healthy Brother   . Healthy Sister   . Healthy Sister   . Cancer Neg Hx   . Stroke Neg Hx   . Heart disease Neg Hx     Social History   Socioeconomic History  . Marital status: Single    Spouse name: Not on file  . Number of children: Not on file  . Years of education: Not on file  . Highest education level: Not on file  Occupational History  . 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 Smoking status: Never Smoker  . Smokeless tobacco: Never Used  Vaping Use  . Vaping Use: Never used  Substance and Sexual Activity  . Alcohol use: Yes    Comment: 2-3 times per week one or two drinks  . Drug use: Yes    Types: Marijuana  . Sexual activity: Yes    Birth control/protection: None  Other Topics Concern  . Not on file  Social History Narrative   Lives with  girlfriend, 1 daughter, some dogs.   Exercise - 15,000 steps daily at work, works at [employer].   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  11/2019.   Social Determinants of Health   Financial Resource Strain: Not on file  Food Insecurity: Not on file  Transportation Needs: Not on file  Physical Activity: Not on file  Stress: Not on file  Social Connections: Not on file  Intimate Partner Violence: Not on file    ROS Review of Systems  Constitutional: Negative.   Respiratory: Negative.   Cardiovascular: Negative.   Gastrointestinal:       Abdominal mass below his sternum  Psychiatric/Behavioral: Positive for sleep disturbance. Negative for self-injury and suicidal ideas. The patient is nervous/anxious.     Objective:   Today's Vitals: BP 121/68   Pulse 68   Temp 99.2 F (37.3 C)   Resp 18   Ht 5\' 11"  (1.803 m)   Wt 155 lb (70.3 kg)   SpO2 97%   BMI 21.62 kg/m   Physical Exam Constitutional:      Appearance: Normal appearance.  Cardiovascular:     Rate and Rhythm: Normal rate and regular rhythm.  Pulses: Normal pulses.     Heart sounds: Normal heart sounds.  Pulmonary:     Effort: Pulmonary effort is normal.     Breath sounds: Normal breath sounds.  Abdominal:     Palpations: There is mass.     Comments: Small palpable mass just below his xiphoid process, maybe size of quarter  Neurological:     Mental Status: He is alert.  Psychiatric:     Comments: Anxious affect     Assessment & Plan:   Problem List Items Addressed This Visit      Respiratory   Mild persistent asthma without complication    -uses albuterol every 1-2 days        Other   Insomnia    -tried trazodone in the past -Rx. Seroquel; start 1 week after starting sertraline      Relevant Medications   QUEtiapine (SEROQUEL) 50 MG tablet   Abdominal mass    -soft palpable mass just below his xiphoid process; likely lipoma -he is concerned and would like imaging -[place]  ordered      Relevant Orders   US Abdomen Complete   Encounter to establish care    -records in Epic      Anxiety    -GAD-7 = 21, indicating severe anxiety -Rx. Sertraline -uses marijuana -med check in 1 month -discussed role of therapy in severe anxiety, but he is not interested at this time       Relevant Medications   sertraline (ZOLOFT) 50 MG tablet      Outpatient Encounter Medications as of 08/04/2020  Medication Sig  . albuterol (VENTOLIN HFA) 108 (90 Base) MCG/ACT inhaler Inhale 2 puffs into the lungs every 6 (six) hours as needed for wheezing or shortness of breath.  . QUEtiapine (SEROQUEL) 50 MG tablet Take 1 tablet (50 mg total) by mouth at bedtime.  . sertraline (ZOLOFT) 50 MG tablet Take 1 tablet (50 mg total) by mouth daily.  Marland Kitchen triamcinolone (KENALOG) 0.1 % paste Use as directed 1 application in the mouth or throat 2 (two) times daily.  . [DISCONTINUED] traZODone (DESYREL) 50 MG tablet Take 0.5-1 tablets (25-50 mg total) by mouth at bedtime as needed for sleep.  Marland Kitchen BENADRYL 25 MG tablet Take 1 tablet (25 mg total) by mouth every 6 (six) hours as needed for itching.  . [DISCONTINUED] Olopatadine HCl 0.2 % SOLN Place 1 drop into the right eye daily. (Patient not taking: Reported on 08/04/2020)   No facility-administered encounter medications on file as of 08/04/2020.    Follow-up: Return in about 1 month (around 09/03/2020) for Lab follow-up (sertraline, HLD).   Heather Roberts, NP

## 2020-08-04 NOTE — Assessment & Plan Note (Signed)
-  records in [place]

## 2020-08-04 NOTE — Assessment & Plan Note (Signed)
-  GAD-7 = 21, indicating severe anxiety -Rx. Sertraline -uses marijuana -med check in 1 month -discussed role of therapy in severe anxiety, but he is not interested at this time

## 2020-08-04 NOTE — Assessment & Plan Note (Addendum)
-  tried trazodone in the past -Rx. Seroquel; start 1 week after starting sertraline

## 2020-08-04 NOTE — Assessment & Plan Note (Signed)
-  uses albuterol every 1-2 days

## 2020-08-13 ENCOUNTER — Ambulatory Visit (HOSPITAL_COMMUNITY): Admission: RE | Admit: 2020-08-13 | Payer: 59 | Source: Ambulatory Visit

## 2020-08-14 ENCOUNTER — Other Ambulatory Visit: Payer: Self-pay | Admitting: Otolaryngology

## 2020-08-14 DIAGNOSIS — H903 Sensorineural hearing loss, bilateral: Secondary | ICD-10-CM

## 2020-09-01 ENCOUNTER — Ambulatory Visit
Admission: RE | Admit: 2020-09-01 | Discharge: 2020-09-01 | Disposition: A | Discharge status: Home / Self Care | Payer: 59 | Source: Ambulatory Visit | Attending: Otolaryngology | Admitting: Otolaryngology

## 2020-09-01 DIAGNOSIS — H903 Sensorineural hearing loss, bilateral: Secondary | ICD-10-CM

## 2020-09-01 MED ORDER — GADOBENATE DIMEGLUMINE 529 MG/ML IV SOLN
14.0000 mL | Freq: Once | INTRAVENOUS | Status: AC | PRN
  Administered 2020-09-01: 14 mL via INTRAVENOUS

## 2020-09-03 ENCOUNTER — Ambulatory Visit: Payer: 59 | Admitting: Nurse Practitioner

## 2020-11-16 ENCOUNTER — Telehealth: Payer: Self-pay | Admitting: Nurse Practitioner

## 2020-11-16 ENCOUNTER — Other Ambulatory Visit: Payer: Self-pay

## 2020-11-16 DIAGNOSIS — J4530 Mild persistent asthma, uncomplicated: Secondary | ICD-10-CM

## 2020-11-16 MED ORDER — ALBUTEROL SULFATE HFA 108 (90 BASE) MCG/ACT IN AERS: 2.0000 | INHALATION_SPRAY | Freq: Four times a day (QID) | RESPIRATORY_TRACT | 1 refills | Status: DC | PRN

## 2020-11-16 NOTE — Telephone Encounter (Signed)
Rx sent 

## 2020-11-16 NOTE — Telephone Encounter (Signed)
Please send Albuterol inhalor -he is out

## 2020-12-21 ENCOUNTER — Other Ambulatory Visit: Payer: Self-pay

## 2020-12-21 ENCOUNTER — Ambulatory Visit: Payer: 59 | Admitting: Medical

## 2020-12-21 VITALS — BP 120/82 | HR 77 | Wt 164.2 lb

## 2020-12-21 DIAGNOSIS — M25561 Pain in right knee: Secondary | ICD-10-CM | POA: Diagnosis not present

## 2020-12-21 DIAGNOSIS — F419 Anxiety disorder, unspecified: Secondary | ICD-10-CM

## 2020-12-21 DIAGNOSIS — G8929 Other chronic pain: Secondary | ICD-10-CM | POA: Diagnosis not present

## 2020-12-21 DIAGNOSIS — G479 Sleep disorder, unspecified: Secondary | ICD-10-CM | POA: Diagnosis not present

## 2020-12-21 MED ORDER — BUSPIRONE HCL 7.5 MG PO TABS: 7.5000 mg | ORAL_TABLET | Freq: Two times a day (BID) | ORAL | 0 refills | Status: DC | PRN

## 2020-12-21 NOTE — Progress Notes (Signed)
Subjective:  David Cobb is a 35 y.o. male who presents for Chief Complaint  Patient presents with   Knee Pain    Knee pain x 2 years. Sometimes feels like its ripped     Since last visit he had an ENT in [place] and establish with another provider.  He called their office today but cannot get in for a couple months.  having right knee problems x 2 years.  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  If foot catches on something, feels like knee is ripping.   Doesn't recall specific injury or trauma, but has done a lot of sports over the years.  In car driving for a while hurts, particular when getting out of the car.   No pain in right hip or ankle . Does have flat feet, wears arch supports.  No swelling.  No numbness or tingling.  No prior orthopedist.    Was having pain in low back last year, started seeing chiropractor, got shoe inserts and that pain has resolved.  He has concerns about anxiety.   He notes in the evening when home from work, has a hard time calming down and resting mind.  Hard to get to sleep at times, hard to wind down.  Uses stretching, meditation.  No prior counseling other than brief couples counseling.  The other provider he has been seeing prescribed Sertraline and Seroquel.   He didn't notice benefit with these. He prior didn't get benefit with trazodone for sleep.  He has tried a friend's xanax and that worked great for him.  Wants prescription for this.   No depressed mood, no mood swings.     No other aggravating or relieving factors.    No other c/o.  The following portions of the patient's history were reviewed and updated as appropriate: allergies, current medications, past family history, past medical history, past social history, past surgical history and problem list.  ROS Otherwise as in subjective above  Objective: BP 120/82   Pulse 77   Wt 164 lb 3.2 oz (74.5 kg)   BMI 22.90 kg/m   General appearance:  alert, no distress, well developed, well nourished Psych: answers questions appropriate, good eye contact MSK: Right knee with mild tenderness over the anterior medial joint line,, crepitus felt with the right knee with flexion and extension, otherwise nontender to palpation, there is some bony prominences of the lateral joint line and proximal lateral superior portion of the tibia bilateral otherwise no deformity.  Slight laxity of right knee compared to left with anterior drawer, otherwise no laxity no other tenderness no swelling no deformity.  Rest of leg exam unremarkable Legs neurovascularly intact    Assessment: Encounter Diagnoses  Name Primary?   Chronic pain of right knee Yes   Anxiety    Sleep disturbance      Plan: Chronic right knee pain-we discussed symptoms and exam findings.  Will refer for MRI for further evaluation.  We discussed differential diagnosis  Anxiety, sleep disturbance-it was not clear whether he plans to return to the other office he has been seeing, Dr. Wallace Cullens, or establishing back here for care.  It would not be appropriate to hop back-and-forth for care particularly for mental health issues.  I do not feel comfortable prescribing Xanax.  He had prior seeing improvement on Seroquel sertraline or trazodone for symptoms.  Continue with meditation and stretching.  Advised individual cousenling.  I gave the  option of hydroxyzine versus buspirone.  Begin trial of this and plan to follow-up with counseling, follow-up with [place] or Dr. Wallace Cullens but not back-and-forth for care.   David Cobb was seen today for knee pain.  Diagnoses and all orders for this visit:  Chronic pain of right knee -     MR Knee Right Wo Contrast; Future  Anxiety  Sleep disturbance  Other orders -     busPIRone (BUSPAR) 7.5 MG tablet; Take 1 tablet (7.5 mg total) by mouth 2 (two) times daily as needed.   Follow up: pending MRI

## 2021-01-11 ENCOUNTER — Ambulatory Visit
Admission: RE | Admit: 2021-01-11 | Discharge: 2021-01-11 | Disposition: A | Discharge status: Home / Self Care | Payer: 59 | Source: Ambulatory Visit | Attending: Medical | Admitting: Medical

## 2021-01-11 ENCOUNTER — Other Ambulatory Visit: Payer: Self-pay

## 2021-01-11 DIAGNOSIS — G8929 Other chronic pain: Secondary | ICD-10-CM

## 2021-01-12 ENCOUNTER — Other Ambulatory Visit: Payer: Self-pay | Admitting: Internal Medicine

## 2021-01-12 DIAGNOSIS — G8929 Other chronic pain: Secondary | ICD-10-CM

## 2021-01-15 ENCOUNTER — Encounter: Payer: Self-pay | Admitting: Orthopaedic Surgery

## 2021-01-15 ENCOUNTER — Ambulatory Visit (INDEPENDENT_AMBULATORY_CARE_PROVIDER_SITE_OTHER): Payer: 59 | Admitting: Orthopaedic Surgery

## 2021-01-15 ENCOUNTER — Other Ambulatory Visit: Payer: Self-pay

## 2021-01-15 DIAGNOSIS — S838X1A Sprain of other specified parts of right knee, initial encounter: Secondary | ICD-10-CM | POA: Insufficient documentation

## 2021-01-15 NOTE — Progress Notes (Signed)
Office Visit Note   Patient: David Cobb           Date of Birth: May 30, 1985           MRN: [id number] Visit Date: 01/15/2021              Requested by: David Canavan, PA-C [address] [place],  [place] [address] PCP: David Canavan, PA-C   Assessment & Plan: Visit Diagnoses:  1. Acute medial meniscal injury of knee, right, initial encounter     Plan: Impression is acute right medial meniscal tear.  MRI of the right knee independently reviewed and interpreted does show complex tear of the posterior horn medial meniscus.  Given the lack of relief and improvement from conservative management in terms of activity modification, strengthening program in the last 2 years I have recommended arthroscopic partial medial meniscectomy.  Follow-Up Instructions: No follow-ups on file.   Orders:  No orders of the defined types were placed in this encounter.  No orders of the defined types were placed in this encounter.     Procedures: No procedures performed   Clinical Data: No additional findings.   Subjective: Chief Complaint  Patient presents with   Right Knee - Pain    David Cobb is a 35 year old healthy gentleman who comes in for evaluation of acute right medial meniscal tear recently found on MRI.  He is a referral from David Cobb.  He has had pain to the medial aspect of the knee for the last 2 years without any definite injuries or trauma.  He now has sharp stabbing pain at the medial joint line that also causes giving way and occasional locking.  He has trouble pivoting and changing directions.  He has noticed an effusion towards the end of the day.  He works in Building control surveyor.   Review of Systems  Constitutional: Negative.   All other systems reviewed and are negative.   Objective: Vital Signs: There were no vitals taken for this visit.  Physical Exam Vitals and nursing note reviewed.  Constitutional:      Appearance: He is well-developed.   Pulmonary:     Effort: Pulmonary effort is normal.  Abdominal:     Palpations: Abdomen is soft.  Skin:    General: Skin is warm.  Neurological:     Mental Status: He is alert and oriented to person, place, and time.  Psychiatric:        Behavior: Behavior normal.        Thought Content: Thought content normal.        Judgment: Judgment normal.    Ortho Exam  Right knee shows no joint effusion.  Significant medial joint line tenderness with positive McMurray's sign.  Range of motion is relatively well-preserved.  Collaterals and cruciates are stable.  Specialty Comments:  No specialty comments available.  Imaging: No results found.   PMFS History: Patient Active Problem List   Diagnosis Date Noted   Acute medial meniscal injury of knee, right, initial encounter 01/15/2021   Chronic pain of right knee 12/21/2020   Sleep disturbance 12/21/2020   Anxiety 08/04/2020   Mouth ulcer 11/26/2019   Abdominal mass 11/26/2019   Encounter to establish care 11/26/2019   History of allergy 11/26/2019   Mild persistent asthma without complication 10/31/2019   Insomnia 10/31/2019   Past Medical History:  Diagnosis Date   Allergy    seasonal as well as ASA and ibuprofen   Asthma  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   Hyperlipidemia     Family History  Problem Relation Age of Onset   Healthy Mother    Asthma Father    Healthy Sister    Asthma Brother    Diabetes Paternal Grandfather    Healthy Brother    Healthy Brother    Healthy Sister    Healthy Sister    Cancer Neg Hx    Stroke Neg Hx    Heart disease Neg Hx     Past Surgical History:  Procedure Laterality Date   NO PAST SURGERIES  11/2019   Social History   Occupational History   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

## 2021-01-19 ENCOUNTER — Other Ambulatory Visit: Payer: Self-pay

## 2021-01-20 ENCOUNTER — Other Ambulatory Visit: Payer: Self-pay

## 2021-01-20 ENCOUNTER — Encounter (HOSPITAL_BASED_OUTPATIENT_CLINIC_OR_DEPARTMENT_OTHER): Payer: Self-pay | Admitting: Orthopaedic Surgery

## 2021-01-21 ENCOUNTER — Telehealth: Payer: Self-pay | Admitting: Orthopaedic Surgery

## 2021-01-27 ENCOUNTER — Encounter (HOSPITAL_BASED_OUTPATIENT_CLINIC_OR_DEPARTMENT_OTHER)
Admission: RE | Disposition: A | Discharge status: Home / Self Care | Payer: Self-pay | Source: Home / Self Care | Attending: Orthopaedic Surgery

## 2021-01-27 ENCOUNTER — Ambulatory Visit (HOSPITAL_BASED_OUTPATIENT_CLINIC_OR_DEPARTMENT_OTHER): Payer: 59 | Admitting: Anesthesiology

## 2021-01-27 ENCOUNTER — Other Ambulatory Visit: Payer: Self-pay

## 2021-01-27 ENCOUNTER — Ambulatory Visit (HOSPITAL_BASED_OUTPATIENT_CLINIC_OR_DEPARTMENT_OTHER)
Admission: RE | Admit: 2021-01-27 | Discharge: 2021-01-27 | Disposition: A | Discharge status: Home / Self Care | Payer: 59 | Attending: Orthopaedic Surgery | Admitting: Orthopaedic Surgery

## 2021-01-27 ENCOUNTER — Encounter (HOSPITAL_BASED_OUTPATIENT_CLINIC_OR_DEPARTMENT_OTHER): Payer: Self-pay | Admitting: Orthopaedic Surgery

## 2021-01-27 DIAGNOSIS — S83241A Other tear of medial meniscus, current injury, right knee, initial encounter: Secondary | ICD-10-CM | POA: Diagnosis not present

## 2021-01-27 DIAGNOSIS — X58XXXA Exposure to other specified factors, initial encounter: Secondary | ICD-10-CM | POA: Diagnosis not present

## 2021-01-27 DIAGNOSIS — S838X1A Sprain of other specified parts of right knee, initial encounter: Secondary | ICD-10-CM | POA: Diagnosis present

## 2021-01-27 DIAGNOSIS — F419 Anxiety disorder, unspecified: Secondary | ICD-10-CM | POA: Insufficient documentation

## 2021-01-27 DIAGNOSIS — J45909 Unspecified asthma, uncomplicated: Secondary | ICD-10-CM | POA: Diagnosis not present

## 2021-01-27 HISTORY — PX: KNEE ARTHROSCOPY WITH MENISCAL REPAIR: SHX5653

## 2021-01-27 SURGERY — ARTHROSCOPY, KNEE, WITH MENISCUS REPAIR
Anesthesia: General | Site: Knee | Laterality: Right

## 2021-01-27 MED ORDER — LACTATED RINGERS IV SOLN: INTRAVENOUS | Status: DC

## 2021-01-27 MED ORDER — DEXAMETHASONE SODIUM PHOSPHATE 10 MG/ML IJ SOLN
INTRAMUSCULAR | Status: DC | PRN
  Administered 2021-01-27: 10 mg via INTRAVENOUS

## 2021-01-27 MED ORDER — OXYCODONE HCL 5 MG/5ML PO SOLN: 5.0000 mg | Freq: Once | ORAL | Status: AC | PRN

## 2021-01-27 MED ORDER — PROPOFOL 10 MG/ML IV BOLUS
INTRAVENOUS | Status: AC
  Filled 2021-01-27: qty 20

## 2021-01-27 MED ORDER — FENTANYL CITRATE (PF) 100 MCG/2ML IJ SOLN
INTRAMUSCULAR | Status: DC | PRN
  Administered 2021-01-27 (×4): 25 ug via INTRAVENOUS

## 2021-01-27 MED ORDER — DEXAMETHASONE SODIUM PHOSPHATE 10 MG/ML IJ SOLN
INTRAMUSCULAR | Status: AC
  Filled 2021-01-27: qty 1

## 2021-01-27 MED ORDER — FENTANYL CITRATE (PF) 100 MCG/2ML IJ SOLN
INTRAMUSCULAR | Status: AC
  Filled 2021-01-27: qty 2

## 2021-01-27 MED ORDER — LIDOCAINE HCL (CARDIAC) PF 100 MG/5ML IV SOSY
PREFILLED_SYRINGE | INTRAVENOUS | Status: DC | PRN
  Administered 2021-01-27: 60 mg via INTRAVENOUS

## 2021-01-27 MED ORDER — ONDANSETRON HCL 4 MG/2ML IJ SOLN
INTRAMUSCULAR | Status: AC
  Filled 2021-01-27: qty 2

## 2021-01-27 MED ORDER — OXYCODONE HCL 5 MG PO TABS
5.0000 mg | ORAL_TABLET | Freq: Once | ORAL | Status: AC | PRN
  Administered 2021-01-27: 5 mg via ORAL

## 2021-01-27 MED ORDER — LIDOCAINE 2% (20 MG/ML) 5 ML SYRINGE
INTRAMUSCULAR | Status: AC
  Filled 2021-01-27: qty 5

## 2021-01-27 MED ORDER — ONDANSETRON HCL 4 MG/2ML IJ SOLN
INTRAMUSCULAR | Status: DC | PRN
  Administered 2021-01-27: 4 mg via INTRAVENOUS

## 2021-01-27 MED ORDER — CEFAZOLIN SODIUM-DEXTROSE 2-4 GM/100ML-% IV SOLN
2.0000 g | INTRAVENOUS | Status: AC
  Administered 2021-01-27: 2 g via INTRAVENOUS

## 2021-01-27 MED ORDER — AMISULPRIDE (ANTIEMETIC) 5 MG/2ML IV SOLN: 10.0000 mg | Freq: Once | INTRAVENOUS | Status: DC | PRN

## 2021-01-27 MED ORDER — OXYCODONE HCL 5 MG PO TABS
ORAL_TABLET | ORAL | Status: AC
  Filled 2021-01-27: qty 1

## 2021-01-27 MED ORDER — PROMETHAZINE HCL 25 MG/ML IJ SOLN: 6.2500 mg | INTRAMUSCULAR | Status: DC | PRN

## 2021-01-27 MED ORDER — PROPOFOL 10 MG/ML IV BOLUS
INTRAVENOUS | Status: DC | PRN
  Administered 2021-01-27: 200 mg via INTRAVENOUS

## 2021-01-27 MED ORDER — CEFAZOLIN SODIUM-DEXTROSE 2-4 GM/100ML-% IV SOLN
INTRAVENOUS | Status: AC
  Filled 2021-01-27: qty 100

## 2021-01-27 MED ORDER — MIDAZOLAM HCL 5 MG/5ML IJ SOLN
INTRAMUSCULAR | Status: DC | PRN
  Administered 2021-01-27: 2 mg via INTRAVENOUS

## 2021-01-27 MED ORDER — HYDROCODONE-ACETAMINOPHEN 5-325 MG PO TABS: 1.0000 | ORAL_TABLET | Freq: Four times a day (QID) | ORAL | 0 refills | Status: DC | PRN

## 2021-01-27 MED ORDER — MEPERIDINE HCL 25 MG/ML IJ SOLN: 6.2500 mg | INTRAMUSCULAR | Status: DC | PRN

## 2021-01-27 MED ORDER — MIDAZOLAM HCL 2 MG/2ML IJ SOLN
INTRAMUSCULAR | Status: AC
  Filled 2021-01-27: qty 2

## 2021-01-27 MED ORDER — HYDROMORPHONE HCL 1 MG/ML IJ SOLN: 0.2500 mg | INTRAMUSCULAR | Status: DC | PRN

## 2021-01-27 MED ORDER — SODIUM CHLORIDE 0.9 % IR SOLN
Status: DC | PRN
  Administered 2021-01-27: 3000 mL

## 2021-01-27 MED ORDER — BUPIVACAINE HCL (PF) 0.25 % IJ SOLN
INTRAMUSCULAR | Status: DC | PRN
  Administered 2021-01-27: 20 mL

## 2021-01-27 SURGICAL SUPPLY — 35 items
BANDAGE ESMARK 6X9 LF (GAUZE/BANDAGES/DRESSINGS) IMPLANT
BLADE EXCALIBUR 4.0MM X 13CM (MISCELLANEOUS)
BLADE EXCALIBUR 4.0X13 (MISCELLANEOUS) IMPLANT
BLADE SHAVER TORPEDO 4X13 (MISCELLANEOUS) IMPLANT
BNDG ELASTIC 6X5.8 VLCR STR LF (GAUZE/BANDAGES/DRESSINGS) ×6 IMPLANT
BNDG ESMARK 6X9 LF (GAUZE/BANDAGES/DRESSINGS)
COOLER ICEMAN CLASSIC (MISCELLANEOUS) ×3 IMPLANT
CUFF TOURN SGL QUICK 34 (TOURNIQUET CUFF) ×2
CUFF TRNQT CYL 34X4.125X (TOURNIQUET CUFF) ×1 IMPLANT
DRAPE ARTHROSCOPY W/POUCH 90 (DRAPES) ×3 IMPLANT
DRAPE IMP U-DRAPE 54X76 (DRAPES) ×3 IMPLANT
DRAPE U-SHAPE 47X51 STRL (DRAPES) ×3 IMPLANT
DURAPREP 26ML APPLICATOR (WOUND CARE) ×3 IMPLANT
GAUZE SPONGE 4X4 12PLY STRL (GAUZE/BANDAGES/DRESSINGS) ×3 IMPLANT
GAUZE XEROFORM 1X8 LF (GAUZE/BANDAGES/DRESSINGS) ×3 IMPLANT
GLOVE SURG NEOP MICRO LF SZ7.5 (GLOVE) ×3 IMPLANT
GLOVE SURG POLYISO LF SZ6.5 (GLOVE) ×3 IMPLANT
GLOVE SURG POLYISO LF SZ7 (GLOVE) ×3 IMPLANT
GLOVE SURG SYN 7.5  E (GLOVE) ×2
GLOVE SURG SYN 7.5 E (GLOVE) ×1 IMPLANT
GLOVE SURG UNDER POLY LF SZ7 (GLOVE) ×6 IMPLANT
GLOVE SURG UNDER POLY LF SZ7.5 (GLOVE) ×3 IMPLANT
GOWN STRL REIN XL XLG (GOWN DISPOSABLE) ×3 IMPLANT
GOWN STRL REUS W/ TWL LRG LVL3 (GOWN DISPOSABLE) ×1 IMPLANT
GOWN STRL REUS W/ TWL XL LVL3 (GOWN DISPOSABLE) ×1 IMPLANT
GOWN STRL REUS W/TWL LRG LVL3 (GOWN DISPOSABLE) ×2
GOWN STRL REUS W/TWL XL LVL3 (GOWN DISPOSABLE) ×2
MANIFOLD NEPTUNE II (INSTRUMENTS) ×3 IMPLANT
PACK ARTHROSCOPY DSU (CUSTOM PROCEDURE TRAY) ×3 IMPLANT
PACK BASIN DAY SURGERY FS (CUSTOM PROCEDURE TRAY) ×3 IMPLANT
PAD COLD SHLDR WRAP-ON (PAD) ×3 IMPLANT
SHEET MEDIUM DRAPE 40X70 STRL (DRAPES) ×3 IMPLANT
SUT ETHILON 3 0 PS 1 (SUTURE) ×3 IMPLANT
TOWEL GREEN STERILE FF (TOWEL DISPOSABLE) ×3 IMPLANT
TUBING ARTHROSCOPY IRRIG 16FT (MISCELLANEOUS) ×3 IMPLANT

## 2021-01-27 NOTE — Transfer of Care (Signed)
Immediate Anesthesia Transfer of Care Note  Patient: Brookes Craine  Procedure(s) Performed: RIGHT KNEE PARTIAL MEDIAL MENISCECTOMY (Right: Knee)  Patient Location: PACU  Anesthesia Type:General  Level of Consciousness: awake, alert  and oriented  Airway & Oxygen Therapy: Patient Spontanous Breathing and Patient connected to face mask oxygen  Post-op Assessment: Report given to RN and Post -op Vital signs reviewed and stable  Post vital signs: Reviewed and stable  Last Vitals:  Vitals Value Taken Time  BP    Temp    Pulse 99 01/27/21 1339  Resp    SpO2 100 % 01/27/21 1339  Vitals shown include unvalidated device data.  Last Pain:  Vitals:   01/27/21 1129  TempSrc: Oral  PainSc: 0-No pain      Patients Stated Pain Goal: 3 (01/27/21 1129)  Complications: No notable events documented.

## 2021-01-27 NOTE — Anesthesia Procedure Notes (Signed)
Procedure Name: LMA Insertion Date/Time: 01/27/2021 12:48 PM Performed by: Lauralyn Primes, CRNA Pre-anesthesia Checklist: Patient identified, Emergency Drugs available, Suction available and Patient being monitored Patient Re-evaluated:Patient Re-evaluated prior to induction Oxygen Delivery Method: Circle system utilized Preoxygenation: Pre-oxygenation with 100% oxygen Induction Type: IV induction Ventilation: Mask ventilation without difficulty LMA: LMA inserted LMA Size: 5.0 Number of attempts: 1 Airway Equipment and Method: Bite block Placement Confirmation: positive ETCO2 Tube secured with: Tape Dental Injury: Teeth and Oropharynx as per pre-operative assessment

## 2021-01-27 NOTE — Anesthesia Postprocedure Evaluation (Signed)
Anesthesia Post Note  Patient: David Cobb  Procedure(s) Performed: RIGHT KNEE PARTIAL MEDIAL MENISCECTOMY (Right: Knee)     Anesthesia Post Evaluation No notable events documented.  Last Vitals:  Vitals:   01/27/21 1400 01/27/21 1445  BP: (!) 136/95 (!) 127/98  Pulse: 65 69  Resp: 20 17  Temp:  36.7 C  SpO2: 99% 99%    Last Pain:  Vitals:   01/27/21 1422  TempSrc:   PainSc: 4                  Aamya Orellana DANIEL

## 2021-01-27 NOTE — Op Note (Signed)
   Surgery Date: 01/27/2021  PREOPERATIVE DIAGNOSES:  1. Right knee medial meniscus tear  POSTOPERATIVE DIAGNOSES:  same  PROCEDURES PERFORMED:  1. Right knee arthroscopy with limited synovectomy 2. Right knee arthroscopy with arthroscopic partial medial meniscectomy 3. Right knee arthroscopy with arthroscopic chondroplasty medial femoral condyle  SURGEON: N. Glee Arvin, M.D.  ASSIST: Starlyn Skeans [place], [place]; necessary for the timely completion of procedure and due to complexity of procedure.  ANESTHESIA:  general  FLUIDS: Per anesthesia record.   ESTIMATED BLOOD LOSS: minimal  DESCRIPTION OF PROCEDURE: David Cobb is a 35 y.o.-year-old male with above mentioned conditions. Full discussion held regarding risks benefits alternatives and complications related surgical intervention. Conservative care options reviewed. All questions answered.  The patient was identified in the preoperative holding area and the operative extremity was marked. The patient was brought to the operating room and transferred to operating table in a supine position. Satisfactory general anesthesia was induced by anesthesiology.    Standard anterolateral, anteromedial arthroscopy portals were obtained. The anteromedial portal was obtained with a spinal needle for localization under direct visualization with subsequent diagnostic findings.   Limited synovectomy was performed for visualization.  Gentle valgus force was placed on the knee and the medial compartment was opened up.  We found a large complex tear with a significant horizontal cleavage component of the mid body and posterior horn.  The root was intact.  Partial medial meniscectomy was performed with a combination of meniscus basket and oscillating shaver back to stable margins.  The tear did involve approximately 50% of the volume of the meniscus in that region.  There was no evidence of chondromalacia in the medial compartment.  Gentle chondroplasty  was performed of the medial femoral condyle.  The remaining portions of the knee were unremarkable.  Gutters were checked for loose bodies.  Excess fluid was removed from the knee joint.  Incisions were closed with interrupted nylon sutures.  Sterile dressings were applied.  Patient tolerated procedure well had no immediate complications.  Suprapatellar pouch and gutters: no synovitis or debris. Patella chondral surface: Grade 0 Trochlear chondral surface: Grade 0 Patellofemoral tracking: normal Medial meniscus: complex tear, horizontal cleavage.  Medial femoral condyle weight bearing surface: Grade 0 Medial tibial plateau: Grade 0 Anterior cruciate ligament:stable Posterior cruciate ligament:stable Lateral meniscus: normal.   Lateral femoral condyle weight bearing surface: Grade 0 Lateral tibial plateau: Grade 0  DISPOSITION: The patient was awakened from general anesthetic, extubated, taken to the recovery room in medically stable condition, no apparent complications. The patient may be weightbearing as tolerated to the operative lower extremity.  Range of motion of right knee as tolerated.  David Reel, MD [facility] 1:24 PM

## 2021-01-27 NOTE — Discharge Instructions (Addendum)

## 2021-01-27 NOTE — Anesthesia Preprocedure Evaluation (Signed)
Anesthesia Evaluation  Patient identified by MRN, date of birth, ID band Patient awake    Reviewed: Allergy & Precautions, H&P , NPO status , Patient's Chart, lab work & pertinent test results  Airway Mallampati: II  TM Distance: >3 FB Neck ROM: Full    Dental no notable dental hx.    Pulmonary asthma ,    Pulmonary exam normal breath sounds clear to auscultation       Cardiovascular negative cardio ROS Normal cardiovascular exam Rhythm:Regular Rate:Normal     Neuro/Psych Anxiety negative neurological ROS  negative psych ROS   GI/Hepatic negative GI ROS, Neg liver ROS,   Endo/Other  negative endocrine ROS  Renal/GU negative Renal ROS  negative genitourinary   Musculoskeletal negative musculoskeletal ROS (+)   Abdominal   Peds negative pediatric ROS (+)  Hematology negative hematology ROS (+)   Anesthesia Other Findings   Reproductive/Obstetrics negative OB ROS                             Anesthesia Physical Anesthesia Plan  ASA: 2  Anesthesia Plan: General   Post-op Pain Management:    Induction: Intravenous  PONV Risk Score and Plan: 2 and Ondansetron, Midazolam and Treatment may vary due to age or medical condition  Airway Management Planned: LMA  Additional Equipment:   Intra-op Plan:   Post-operative Plan: Extubation in OR  Informed Consent: I have reviewed the patients History and Physical, chart, labs and discussed the procedure including the risks, benefits and alternatives for the proposed anesthesia with the patient or authorized representative who has indicated his/her understanding and acceptance.     Dental advisory given  Plan Discussed with: CRNA  Anesthesia Plan Comments:         Anesthesia Quick Evaluation

## 2021-01-27 NOTE — H&P (Signed)
PREOPERATIVE H&P  Chief Complaint: right knee medial meniscal tear  HPI: David Cobb is a 35 y.o. male who presents for surgical treatment of right knee medial meniscal tear.  He denies any changes in medical history.  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   Exercise - 15,000 steps daily at work, works at [employer].   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  11/2019.   Social Determinants of Health   Financial Resource Strain: Not on file  Food Insecurity: Not on file  Transportation Needs: Not on file  Physical Activity: Not on file  Stress: Not on file  Social Connections: Not on file   Family History  Problem Relation Age of Onset   Healthy Mother    Asthma Father    Healthy Sister    Asthma Brother    Diabetes Paternal Grandfather    Healthy Brother    Healthy Brother    Healthy Sister    Healthy Sister    Cancer Neg Hx    Stroke Neg Hx    Heart disease Neg Hx    Allergies  Allergen Reactions    Aspirin Anaphylaxis   Ibuprofen    Prior to Admission medications   Medication Sig Start Date End Date Taking? Authorizing Provider  albuterol (VENTOLIN HFA) 108 (90 Base) MCG/ACT inhaler Inhale 2 puffs into the lungs every 6 (six) hours as needed for wheezing or shortness of breath. 11/16/20  Yes Heather Roberts, NP  BENADRYL 25 MG tablet Take 1 tablet (25 mg total) by mouth every 6 (six) hours as needed for itching. 09/24/11 11/26/19  Jethro Bastos, NP     Positive ROS: All other systems have been reviewed and were otherwise negative with the exception of those mentioned in the HPI and as above.  Physical Exam: General: Alert, no acute distress Cardiovascular: No pedal edema Respiratory: No cyanosis, no use of accessory musculature GI: abdomen soft Skin: No lesions in the area of chief complaint Neurologic: Sensation intact distally Psychiatric: Patient is competent for consent with normal mood and affect Lymphatic: no lymphedema  MUSCULOSKELETAL: exam stable  Assessment: right knee medial meniscal tear  Plan: Plan for Procedure(s): RIGHT KNEE PARTIAL MEDIAL MENISCECTOMY  The risks benefits and alternatives were discussed with the patient including but not limited to  the risks of nonoperative treatment, versus surgical intervention including infection, bleeding, nerve injury,  blood clots, cardiopulmonary complications, morbidity, mortality, among others, and they were willing to proceed.   Preoperative templating of the joint replacement has been completed, documented, and submitted to the Operating Room personnel in order to optimize intra-operative equipment management.   Glee Arvin, MD 01/27/2021 11:36 AM

## 2021-02-01 ENCOUNTER — Encounter (HOSPITAL_BASED_OUTPATIENT_CLINIC_OR_DEPARTMENT_OTHER): Payer: Self-pay | Admitting: Orthopaedic Surgery

## 2021-02-02 ENCOUNTER — Ambulatory Visit: Payer: 59 | Admitting: Nurse Practitioner

## 2021-02-03 ENCOUNTER — Other Ambulatory Visit: Payer: Self-pay

## 2021-02-03 ENCOUNTER — Ambulatory Visit (INDEPENDENT_AMBULATORY_CARE_PROVIDER_SITE_OTHER): Payer: 59 | Admitting: Physician Assistant

## 2021-02-03 ENCOUNTER — Ambulatory Visit: Payer: 59 | Admitting: Medical

## 2021-02-03 ENCOUNTER — Encounter: Payer: Self-pay | Admitting: Orthopaedic Surgery

## 2021-02-03 VITALS — BP 120/84 | HR 65 | Wt 163.0 lb

## 2021-02-03 DIAGNOSIS — F419 Anxiety disorder, unspecified: Secondary | ICD-10-CM

## 2021-02-03 DIAGNOSIS — F5104 Psychophysiologic insomnia: Secondary | ICD-10-CM

## 2021-02-03 DIAGNOSIS — Z9889 Other specified postprocedural states: Secondary | ICD-10-CM

## 2021-02-03 DIAGNOSIS — Z638 Other specified problems related to primary support group: Secondary | ICD-10-CM | POA: Diagnosis not present

## 2021-02-03 DIAGNOSIS — G479 Sleep disorder, unspecified: Secondary | ICD-10-CM

## 2021-02-03 MED ORDER — TEMAZEPAM 7.5 MG PO CAPS: 7.5000 mg | ORAL_CAPSULE | Freq: Every evening | ORAL | 0 refills | Status: DC | PRN

## 2021-02-03 NOTE — Progress Notes (Signed)
Subjective:  David Cobb is a 35 y.o. male who presents for Chief Complaint  Patient presents with   follow-up     Follow-up on sleep and anixety     Here for f/u on sleep, anxiety.  Last visit we begin trial of BuSpar.  However after several days of taking this he felt unusual.  He did not like the way it made him feel so he stopped this.  We started this to help with anxious feelings and difficulty getting to sleep.  He has chronic problems with insomnia.  He has a hard time unwinding at the end of the day despite trying several recommended steps to unwind.  He exercises regularly.  He works a lot of hours.  He has 2 different jobs working in [employer].  He works 60 to 80 hours/week.  Lately he has had added stress as he and his fiance are separated now.  They were together for 6 months but recently they separated.  She has the house, the baby, and the dog and he had to move out.  This has been a very stressful time.  He notes in the evening when home from work, has a hard time calming down and resting mind.  Hard to get to sleep at times, hard to wind down.  Uses stretching, meditation.  No prior counseling other than brief couples counseling.  He denies depressed mood.  No major mood swings.  No history of bipolar depression.  No history of ADD.  He feels very focused at work and has no problem getting things accomplished and staying on task  He feels like he can deal with the anxiety and stress but he cannot deal with not getting sleep.  He wants something to help with sleep and so far things he has tried has not worked out well  Prior medication: Seroquel made him feel foggy the next day Trazodone made him feel awful BuSpar made him feel weird Sertraline did not seem to help either way with anxiety or sleep  No other aggravating or relieving factors.    No other c/o.  The following portions of the patient's history were reviewed and updated as appropriate: allergies, current  medications, past family history, past medical history, past social history, past surgical history and problem list.  ROS Otherwise as in subjective above  Objective: BP 120/84   Pulse 65   Wt 163 lb (73.9 kg)   BMI 22.73 kg/m   General appearance: alert, no distress, well developed, well nourished, but seems a little anxious Psych: answers questions appropriately, good eye contact     Assessment: Encounter Diagnoses  Name Primary?   Chronic insomnia Yes   Sleep disturbance    Anxiety    Stress due to family tension       Plan: We reviewed for medications.  I have been his allergies and intolerances.  From last visit he did not tolerate BuSpar.  We will begin trial of temazepam.  We discussed risk and benefits and proper use of medication.  We discussed the need to work on good coping skills, and we discussed several strategies for trying to relax and unwind.  I recommended he see a Veterinary surgeon.  I was sorry to hear about his separation from his fiance.  Again encouraged counseling to work through this time and stress  Continue to work on good sleep hygiene measures   Rooney was seen today for follow-up .  Diagnoses and all orders for this visit:  Chronic insomnia  Sleep disturbance  Anxiety  Stress due to family tension  Other orders -     temazepam (RESTORIL) 7.5 MG capsule; Take 1 capsule (7.5 mg total) by mouth at bedtime as needed for sleep.    Follow up: with call back in 2 weeks

## 2021-02-03 NOTE — Progress Notes (Signed)
   Post-Op Visit Note   Patient: David Cobb           Date of Birth: Mar 24, 1985           MRN: [id number] Visit Date: 02/03/2021 PCP: Jac Canavan, PA-C   Assessment & Plan:  Chief Complaint:  Chief Complaint  Patient presents with   Right Knee - Follow-up    Right knee arthroscopy with partial medial meniscectomy 01/27/2021   Visit Diagnoses:  1. S/P arthroscopy of right knee     Plan: Patient is a pleasant 35 year old gentleman who comes in today 1 week out right knee arthroscopic debridement medial meniscus 01/27/2021.  He has been doing well.  Slight burning but no pain.  Examination of the right knee reveals 2 well healed surgical portals with nylon sutures in place.  No evidence of infection or cellulitis.  Calf is soft nontender.  He is neurovascular intact distally.  Today, sutures were removed and Steri-Strips applied.  Intraoperative pictures reviewed.  Home exercise program provided.  He will follow-up with [place] in 5 weeks time for recheck.  Call with concerns or questions.  Follow-Up Instructions: Return in about 5 weeks (around 03/10/2021).   Orders:  No orders of the defined types were placed in this encounter.  No orders of the defined types were placed in this encounter.   Imaging: No new imaging  PMFS History: Patient Active Problem List   Diagnosis Date Noted   Acute medial meniscal injury of knee, right, initial encounter 01/15/2021   Chronic pain of right knee 12/21/2020   Sleep disturbance 12/21/2020   Anxiety 08/04/2020   Mouth ulcer 11/26/2019   Abdominal mass 11/26/2019   Encounter to establish care 11/26/2019   History of allergy 11/26/2019   Mild persistent asthma without complication 10/31/2019   Insomnia 10/31/2019   Past Medical History:  Diagnosis Date   Allergy    seasonal as well as ASA and ibuprofen   Asthma    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   Hyperlipidemia     Family History  Problem  Relation Age of Onset   Healthy Mother    Asthma Father    Healthy Sister    Asthma Brother    Diabetes Paternal Actor    Healthy Brother    Healthy Brother    Healthy Sister    Healthy Sister    Cancer Neg Hx    Stroke Neg Hx    Heart disease Neg Hx     Past Surgical History:  Procedure Laterality Date   KNEE ARTHROSCOPY WITH MENISCAL REPAIR Right 01/27/2021   Procedure: RIGHT KNEE PARTIAL MEDIAL MENISCECTOMY;  Surgeon: Tarry Kos, MD;  Location: [facility];  Service: Orthopedics;  Laterality: Right;   NO PAST SURGERIES  11/2019   Social History   Occupational History   Occupation: Scientist, 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

## 2021-03-03 ENCOUNTER — Other Ambulatory Visit: Payer: Self-pay | Admitting: Medical

## 2021-03-03 MED ORDER — TEMAZEPAM 15 MG PO CAPS: 15.0000 mg | ORAL_CAPSULE | Freq: Every evening | ORAL | 0 refills | Status: DC | PRN

## 2021-03-10 ENCOUNTER — Encounter: Payer: 59 | Admitting: Orthopaedic Surgery

## 2021-03-31 ENCOUNTER — Ambulatory Visit: Payer: Medicaid Other | Admitting: Orthopaedic Surgery

## 2021-04-06 ENCOUNTER — Other Ambulatory Visit: Payer: Self-pay

## 2021-04-06 ENCOUNTER — Ambulatory Visit (INDEPENDENT_AMBULATORY_CARE_PROVIDER_SITE_OTHER): Payer: 59 | Admitting: Orthopaedic Surgery

## 2021-04-06 ENCOUNTER — Encounter: Payer: Self-pay | Admitting: Orthopaedic Surgery

## 2021-04-06 DIAGNOSIS — Z9889 Other specified postprocedural states: Secondary | ICD-10-CM

## 2021-04-06 MED ORDER — DICLOFENAC SODIUM 75 MG PO TBEC: 75.0000 mg | DELAYED_RELEASE_TABLET | Freq: Two times a day (BID) | ORAL | 2 refills | Status: DC

## 2021-04-06 NOTE — Progress Notes (Signed)
Post-Op Visit Note   Patient: David Cobb           Date of Birth: [birth year]           MRN: [id number] Visit Date: 04/06/2021 PCP: David Hurl, PA-C   Assessment & Plan:  Chief Complaint:  Chief Complaint  Patient presents with   Right Knee - Routine Post Op   Visit Diagnoses:  1. S/P arthroscopy of right knee     Plan: David Cobb is 9 weeks status post partial medial meniscectomy.  She he is doing well in that regard but he feels like he still has some pain on the lateral side of the knee.  Is worse with deep flexion and towards the end of the day after a long day of activity.  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  Examination of the right knee shows no joint effusion.  No joint line tenderness.  He endorses discomfort along the biceps femoris tendon and slightly distal to the fibular head.  Negative Tinel at the peroneal nerve.  Negative McMurray at the lateral joint line.  Collaterals and cruciates are stable.  Va has done well from the surgery overall.  It's unclear as to what the lateral knee pain is but I feel that is overuse and inflammatory in nature.  I reviewed the MRI and the op note and the intraoperative photos which do not explain his pain.  Would like him to try 2 weeks of relative rest and a prescription for diclofenac twice a day.  He will get back in touch with me if this does not improve.  We may need to consider physical therapy or steroid pack.  Follow-Up Instructions: No follow-ups on file.   Orders:  No orders of the defined types were placed in this encounter.  Meds ordered this encounter  Medications   diclofenac (VOLTAREN) 75 MG EC tablet    Sig: Take 1 tablet (75 mg total) by mouth 2 (two) times daily.    Dispense:  30 tablet    Refill:  2    Imaging: No results found.  PMFS History: Patient Active Problem List   Diagnosis Date Noted   Chronic insomnia 02/03/2021   Stress due to family tension 02/03/2021   Acute medial  meniscal injury of knee, right, initial encounter 01/15/2021   Chronic pain of right knee 12/21/2020   Sleep disturbance 12/21/2020   Anxiety 08/04/2020   Mouth ulcer 11/26/2019   Abdominal mass 11/26/2019   Encounter to establish care 11/26/2019   History of allergy 11/26/2019   Mild persistent asthma without complication [id number]   Insomnia 10/31/2019   Past Medical History:  Diagnosis Date   Allergy    seasonal as well as ASA and ibuprofen   Asthma    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   Hyperlipidemia     Family History  Problem Relation Age of Onset   Healthy Mother    Asthma Father    Healthy Sister    Asthma Brother    Diabetes Paternal Merchant navy officer    Healthy Brother    Healthy Brother    Healthy Sister    Healthy Sister    Cancer Neg Hx    Stroke Neg Hx    Heart disease Neg Hx     Past Surgical History:  Procedure Laterality Date   KNEE ARTHROSCOPY WITH MENISCAL REPAIR Right 01/27/2021   Procedure: RIGHT KNEE PARTIAL MEDIAL MENISCECTOMY;  Surgeon: David Cobb, David Kaufmann  M, MD;  Location: [facility];  Service: Orthopedics;  Laterality: Right;   NO PAST SURGERIES  11/2019   Social History   Occupational History   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

## 2021-04-09 ENCOUNTER — Telehealth: Payer: Self-pay

## 2021-04-09 ENCOUNTER — Other Ambulatory Visit: Payer: Self-pay | Admitting: Medical

## 2021-04-09 MED ORDER — TEMAZEPAM 15 MG PO CAPS: 15.0000 mg | ORAL_CAPSULE | Freq: Every evening | ORAL | 0 refills | Status: DC | PRN

## 2021-04-09 NOTE — Telephone Encounter (Signed)
Received fax from [facility] [address] [place] [address] for a refill on his Temazepam 15mg . Pt. Last apt was 02/03/21. THIS IS A NEW PHARMACY PLEASE SEND TO THIS ONE.

## 2021-07-01 ENCOUNTER — Other Ambulatory Visit: Payer: Self-pay | Admitting: Medical

## 2021-07-01 MED ORDER — TEMAZEPAM 15 MG PO CAPS: 15.0000 mg | ORAL_CAPSULE | Freq: Every evening | ORAL | 1 refills | Status: DC | PRN

## 2021-11-10 ENCOUNTER — Encounter: Payer: Self-pay | Admitting: Internal Medicine

## 2021-12-14 ENCOUNTER — Encounter: Payer: Self-pay | Admitting: Internal Medicine

## 2022-01-27 ENCOUNTER — Encounter: Payer: Self-pay | Admitting: Orthopaedic Surgery

## 2022-02-02 ENCOUNTER — Telehealth: Payer: Self-pay | Admitting: Medical

## 2022-02-02 NOTE — Telephone Encounter (Signed)
Pt informed, needs appt. He will call back He has moved to [place], [place] and may find a doctor closer to home

## 2022-02-02 NOTE — Telephone Encounter (Signed)
Pt has not been seen in over a year. He will need an appointment

## 2022-02-02 NOTE — Telephone Encounter (Signed)
Pt called requesting refill on albuterol inhaler  [facility] [place], [place]

## 2022-03-04 ENCOUNTER — Telehealth: Payer: 59 | Admitting: Physician Assistant

## 2022-03-04 DIAGNOSIS — R6889 Other general symptoms and signs: Secondary | ICD-10-CM

## 2022-03-04 DIAGNOSIS — J4530 Mild persistent asthma, uncomplicated: Secondary | ICD-10-CM

## 2022-03-04 MED ORDER — FLUTICASONE PROPIONATE 50 MCG/ACT NA SUSP: 2.0000 | Freq: Every day | NASAL | 0 refills | Status: DC

## 2022-03-04 MED ORDER — BENZONATATE 100 MG PO CAPS: 100.0000 mg | ORAL_CAPSULE | Freq: Three times a day (TID) | ORAL | 0 refills | Status: DC | PRN

## 2022-03-04 MED ORDER — ALBUTEROL SULFATE HFA 108 (90 BASE) MCG/ACT IN AERS: 2.0000 | INHALATION_SPRAY | Freq: Four times a day (QID) | RESPIRATORY_TRACT | 0 refills | Status: DC | PRN

## 2022-03-04 NOTE — Progress Notes (Signed)
E visit for Flu like symptoms   We are sorry that you are not feeling well.  Here is how we plan to help! Based on what you have shared with me it looks like you may have flu-like symptoms that should be watched but do not seem to indicate anti-viral treatment.  Influenza or "the flu" is   an infection caused by a respiratory virus. The flu virus is highly contagious and persons who did not receive their yearly flu vaccination may "catch" the flu from close contact.  We have anti-viral medications to treat the viruses that cause this infection. They are not a "cure" and only shorten the course of the infection. These prescriptions are most effective when they are given within the first 2 days of "flu" symptoms. Antiviral medication are indicated if you have a high risk of complications from the flu. You should  also consider an antiviral medication if you are in close contact with someone who is at risk. These medications can help patients avoid complications from the flu  but have side effects that you should know. Possible side effects from Tamiflu or oseltamivir include nausea, vomiting, diarrhea, dizziness, headaches, eye redness, sleep problems or other respiratory symptoms. You should not take Tamiflu if you have an allergy to oseltamivir or any to the ingredients in Tamiflu.  Based upon your symptoms and potential risk factors I recommend that you follow the flu symptoms recommendation that I have listed below.  This is an infection that is most likely caused by a virus. There are no specific treatments other than to help you with the symptoms until the infection runs its course.  We are sorry you are not feeling well.  Here is how we plan to help!  For nasal congestion, you may use an oral decongestants such as Mucinex D or if you have glaucoma or high blood pressure use plain Mucinex.  Saline nasal spray or nasal drops can help and can safely be used as often as needed for congestion.  For  your congestion, I have prescribed Fluticasone nasal spray one spray in each nostril twice a day  If you do not have a history of heart disease, hypertension, diabetes or thyroid disease, prostate/bladder issues or glaucoma, you may also use Sudafed to treat nasal congestion.  It is highly recommended that you consult with a pharmacist or your primary care physician to ensure this medication is safe for you to take.     If you have a cough, you may use cough suppressants such as Delsym and Robitussin.  If you have glaucoma or high blood pressure, you can also use Coricidin HBP.   For cough I have prescribed for you A prescription cough medication called Tessalon Perles 100 mg. You may take 1-2 capsules every 8 hours as needed for cough  If you have a sore or scratchy throat, use a saltwater gargle-  to  teaspoon of salt dissolved in a 4-ounce to 8-ounce glass of warm water.  Gargle the solution for approximately 15-30 seconds and then spit.  It is important not to swallow the solution.  You can also use throat lozenges/cough drops and Chloraseptic spray to help with throat pain or discomfort.  Warm or cold liquids can also be helpful in relieving throat pain.  For headache, pain or general discomfort, you can use Ibuprofen or Tylenol as directed.   Some authorities believe that zinc sprays or the use of Echinacea may shorten the course of your symptoms.     ANYONE WHO HAS FLU SYMPTOMS SHOULD: Stay home. The flu is highly contagious and going out or to work exposes others! Be sure to drink plenty of fluids. Water is fine as well as fruit juices, sodas and electrolyte beverages. You may want to stay away from caffeine or alcohol. If you are nauseated, try taking small sips of liquids. How do you know if you are getting enough fluid? Your urine should be a pale yellow or almost colorless. Get rest. Taking a steamy shower or using a humidifier may help nasal congestion and ease sore throat pain. Using a  saline nasal spray works much the same way. Cough drops, hard candies and sore throat lozenges may ease your cough. Line up a caregiver. Have someone check on you regularly.   GET HELP RIGHT AWAY IF: You cannot keep down liquids or your medications. You become short of breath Your fell like you are going to pass out or loose consciousness. Your symptoms persist after you have completed your treatment plan MAKE SURE YOU  Understand these instructions. Will watch your condition. Will get help right away if you are not doing well or get worse.  Your e-visit answers were reviewed by a board certified advanced clinical practitioner to complete your personal care plan.  Depending on the condition, your plan could have included both over the counter or prescription medications.  If there is a problem please reply  once you have received a response from your provider.  Your safety is important to us.  If you have drug allergies check your prescription carefully.    You can use MyChart to ask questions about today's visit, request a non-urgent call back, or ask for a work or school excuse for 24 hours related to this e-Visit. If it has been greater than 24 hours you will need to follow up with your provider, or enter a new e-Visit to address those concerns.  You will get an e-mail in the next two days asking about your experience.  I hope that your e-visit has been valuable and will speed your recovery. Thank you for using e-visits.  I have spent 5 minutes in review of e-visit questionnaire, review and updating patient chart, medical decision making and response to patient.   Broxton Broady M Keshonda Monsour, PA-C  

## 2022-03-04 NOTE — Progress Notes (Signed)
Visit for Asthma  Based on what you have shared with me, it looks like you may have a flare up of your asthma.  Asthma is a chronic (ongoing) lung disease which results in airway obstruction, inflammation and hyper-responsiveness.   Asthma symptoms vary from person to person, with common symptoms including nighttime awakening and decreased ability to participate in normal activities as a result of shortness of breath. It is often triggered by changes in weather, changes in the season, changes in air temperature, or inside (home, school, daycare or work) allergens such as animal dander, mold, mildew, woodstoves or cockroaches.   It can also be triggered by hormonal changes, extreme emotion, physical exertion or an upper respiratory tract illness.     It is important to identify the trigger, and then eliminate or avoid the trigger if possible.   If you have been prescribed medications to be taken on a regular basis, it is important to follow the asthma action plan and to follow guidelines to adjust medication in response to increasing symptoms of decreased peak expiratory flow rate  Treatment: I have prescribed: Albuterol (Proventil HFA; Ventolin HFA) 108 (90 Base) MCG/ACT Inhaler 2 puffs into the lungs every six hours as needed for wheezing or shortness of breath  HOME CARE Only take medications as instructed by your medical team. Consider wearing a mask or scarf to improve breathing air temperature have been shown to decrease irritation and decrease exacerbations Get rest. Taking a steamy shower or using a humidifier may help nasal congestion sand ease sore throat pain. You can place a towel over your head and breathe in the steam from hot water coming from a faucet. Using a saline nasal spray works much the same way.  Cough drops, hare candies and sore throat lozenges may ease your  cough.  Avoid close contacts especially the very you and the elderly Cover your mouth if you cough or sneeze Always remember to wash your hands.    GET HELP RIGHT AWAY IF: You develop worsening symptoms; breathlessness at rest, drowsy, confused or agitated, unable to speak in full sentences You have coughing fits You develop a severe headache or visual changes You develop shortness of breath, difficulty breathing or start having chest pain Your symptoms persist after you have completed your treatment plan If your symptoms do not improve within 10 days  MAKE SURE YOU Understand these instructions. Will watch your condition. Will get help right away if you are not doing well or get worse.   Your e-visit answers were reviewed by a board certified advanced clinical practitioner to complete your personal care plan, Depending upon the condition, your plan could have included both over the counter or prescription medications.   Please review your pharmacy choice. Your safety is important to us. If you have drug allergies check your prescription carefully.  You can use MyChart to ask questions about today's visit, request a non-urgent  call back, or ask for a work or school excuse for 24 hours related to this e-Visit. If it has been greater than 24 hours you will need to follow up with your provider, or enter a new e-Visit to address those concerns.   You will get an e-mail in the next two days asking about your experience. I hope that your e-visit has been valuable and will speed your recovery. Thank you for using e-visits.  I have spent 5 minutes in review of e-visit questionnaire, review and updating patient chart, medical decision making and response   to patient.   Jef Futch M Sebastiano Luecke, PA-C  

## 2022-12-16 ENCOUNTER — Ambulatory Visit: Payer: Medicaid Other | Admitting: Family Medicine

## 2023-01-23 ENCOUNTER — Ambulatory Visit (INDEPENDENT_AMBULATORY_CARE_PROVIDER_SITE_OTHER): Payer: 59 | Admitting: Nurse Practitioner

## 2023-01-23 VITALS — BP 125/84 | HR 86 | Temp 98.1°F | Ht 71.0 in | Wt 168.8 lb

## 2023-01-23 DIAGNOSIS — K2100 Gastro-esophageal reflux disease with esophagitis, without bleeding: Secondary | ICD-10-CM | POA: Diagnosis not present

## 2023-01-23 DIAGNOSIS — Z0001 Encounter for general adult medical examination with abnormal findings: Secondary | ICD-10-CM | POA: Insufficient documentation

## 2023-01-23 DIAGNOSIS — K121 Other forms of stomatitis: Secondary | ICD-10-CM | POA: Diagnosis not present

## 2023-01-23 DIAGNOSIS — J4530 Mild persistent asthma, uncomplicated: Secondary | ICD-10-CM | POA: Diagnosis not present

## 2023-01-23 DIAGNOSIS — Z0000 Encounter for general adult medical examination without abnormal findings: Secondary | ICD-10-CM | POA: Insufficient documentation

## 2023-01-23 DIAGNOSIS — R1906 Epigastric swelling, mass or lump: Secondary | ICD-10-CM | POA: Diagnosis not present

## 2023-01-23 MED ORDER — LIDOCAINE VISCOUS HCL 2 % SOLUTION FOR USE IN EAR (ED/BUG EXTRACTION): 15.0000 mL | Freq: Once | OROMUCOSAL | 0 refills | Status: AC

## 2023-01-23 MED ORDER — LIDOCAINE VISCOUS HCL 2 % SOLUTION FOR USE IN EAR (ED/BUG EXTRACTION): 15.0000 mL | Freq: Once | OROMUCOSAL | 3 refills | Status: DC

## 2023-01-23 MED ORDER — OMEPRAZOLE 20 MG PO CPDR: 20.0000 mg | DELAYED_RELEASE_CAPSULE | Freq: Every day | ORAL | 0 refills | Status: DC

## 2023-01-23 MED ORDER — ALBUTEROL SULFATE HFA 108 (90 BASE) MCG/ACT IN AERS: 2.0000 | INHALATION_SPRAY | Freq: Four times a day (QID) | RESPIRATORY_TRACT | 0 refills | Status: DC | PRN

## 2023-01-23 NOTE — Addendum Note (Signed)
Addended by: Julious Payer D on: 01/23/2023 04:46 PM   Modules accepted: Orders

## 2023-01-23 NOTE — Progress Notes (Signed)
Refill failed. resent °

## 2023-01-23 NOTE — Progress Notes (Signed)
New Patient Office Visit  Subjective    Patient ID: David Cobb, male    DOB: 1985-07-15  Age: 37 y.o. MRN: [id number]  CC:  Chief Complaint  Patient presents with   Establish Care   Mouth Lesions    Whole life has gotten mouth ulcers says they are stress induced     HPI David Cobb 38 year old male present on January 23, 2023 to establish care concern with epigastric mass, GERD.  Past medical history of asthma, mouth ulcer, abdominal mass  GERD  concerns of gastroesophageal reflux disease (GERD). He reports experiencing frequent heartburn, a sour taste in the mouth, and discomfort in the chest and upper abdomen over the past [duration]. The symptoms are not relieved by over-the-counter antacids, such as Tums, and tend to worsen after eating, especially with large or fatty meals. He also mentions occasional regurgitation of food or liquid into his throat, particularly when lying down or at night. The patient denies any difficulty swallowing, significant weight loss, or bleeding (e.g., melena or hematemesis). He has not tried any other medications for symptom relief and does not have a known history of GERD. There are no associated symptoms of nausea or vomiting. . Mouth Ulcer Hx of painful mouth ulcer that has been present for 2 yrs. The ulcer is located upper lips., on the inside of the cheek and is described as painful, especially when eating or speaking. The pain is exacerbated by stress, which the patient reports has been higher than usual recently due to [facility] related stress. The ulcer has not improved with over-the-counter remedies like saltwater rinses or topical gels. The patient denies any significant swelling, fever, or difficulty swallowing, and there is no history of trauma to the area. There is no known history of frequent or recurrent mouth ulcers, although the patient has had similar symptoms during stressful periods in the past. The patient denies any significant  medical history, including autoimmune conditions or systemic illnesses that might predispose to recurrent ulcers.  While  Asthma history of well-controlled asthma, no recent exacerbations or significant respiratory symptoms, such as wheezing, shortness of breath, or coughing. Has been using their inhaler as prescribed, with no issues or need for increased use of rescue medications in the past year. N all o history of recent upper respiratory infections, allergies, or exposure to known asthma triggers. The patient has been adherent to their prescribed asthma management plan, which includes daily use of a maintenance inhaler medications. TNo nighttime awakenings or limitations in physical activity due to asthma. Outpatient Encounter Medications as of 01/23/2023  Medication Sig   omeprazole (PRILOSEC) 20 MG capsule Take 1 capsule (20 mg total) by mouth daily.   [DISCONTINUED] albuterol (VENTOLIN HFA) 108 (90 Base) MCG/ACT inhaler Inhale 2 puffs into the lungs every 6 (six) hours as needed for wheezing or shortness of breath.   [DISCONTINUED] lidocaine (XYLOCAINE) 2 % Use as directed 15 mLs in the mouth or throat once for 1 dose.   albuterol (VENTOLIN HFA) 108 (90 Base) MCG/ACT inhaler Inhale 2 puffs into the lungs every 6 (six) hours as needed for wheezing or shortness of breath.   lidocaine (XYLOCAINE) 2 % Use as directed 15 mLs in the mouth or throat once for 1 dose.   [DISCONTINUED] albuterol (VENTOLIN HFA) 108 (90 Base) MCG/ACT inhaler Inhale 2 puffs into the lungs every 6 (six) hours as needed for wheezing or shortness of breath.   [DISCONTINUED] benzonatate (TESSALON) 100 MG capsule Take 1 capsule (100 mg total)  by mouth 3 (three) times daily as needed. (Patient not taking: Reported on 01/23/2023)   [DISCONTINUED] diclofenac (VOLTAREN) 75 MG EC tablet Take 1 tablet (75 mg total) by mouth 2 (two) times daily. (Patient not taking: Reported on 01/23/2023)   [DISCONTINUED] fluticasone (FLONASE) 50  MCG/ACT nasal spray Place 2 sprays into both nostrils daily. (Patient not taking: Reported on 01/23/2023)   [DISCONTINUED] lidocaine (XYLOCAINE) 2 % Use as directed 15 mLs in the mouth or throat once for 1 dose.   [DISCONTINUED] temazepam (RESTORIL) 15 MG capsule Take 1 capsule (15 mg total) by mouth at bedtime as needed for sleep. (Patient not taking: Reported on 01/23/2023)   [DISCONTINUED] temazepam (RESTORIL) 7.5 MG capsule Take 1 capsule (7.5 mg total) by mouth at bedtime as needed for sleep. (Patient not taking: Reported on 01/23/2023)   No facility-administered encounter medications on file as of 01/23/2023.    Past Medical History:  Diagnosis Date   Allergy    seasonal as well as ASA and ibuprofen   Asthma    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   Hyperlipidemia     Past Surgical History:  Procedure Laterality Date   KNEE ARTHROSCOPY WITH MENISCAL REPAIR Right 01/27/2021   Procedure: RIGHT KNEE PARTIAL MEDIAL MENISCECTOMY;  Surgeon: Tarry Kos, MD;  Location: [facility];  Service: Orthopedics;  Laterality: Right;   NO PAST SURGERIES  11/2019    Family History  Problem Relation Age of Onset   Healthy Mother    Asthma Father    Healthy Sister    Asthma Brother    Diabetes Paternal Actor    Healthy Brother    Healthy Brother    Healthy Sister    Healthy Sister    Cancer Neg Hx    Stroke Neg Hx    Heart disease Neg Hx     Social History   Socioeconomic History   Marital status: Single    Spouse name: Not on file   Number of children: Not on file   Years of education: Not on file   Highest education level: Some college, no degree  Occupational History   Occupation: Scientist, clinical (histocompatibility and immunogenetics)    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   Exercise - 15,000 steps daily at work, works at [employer].   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  11/2019.   Social Determinants of Health   Financial Resource Strain: Low Risk  (01/23/2023)   Overall Financial Resource Strain (CARDIA)    Difficulty of Paying Living Expenses: Not hard at all  Food Insecurity: No Food Insecurity (01/23/2023)   Hunger Vital Sign    Worried About Running Out of Food in the Last Year: Never true    Ran Out of Food in the Last Year: Never true  Transportation Needs: No Transportation Needs (01/23/2023)   PRAPARE - Administrator, Civil Service (Medical): No  Lack of Transportation (Non-Medical): No  Physical Activity: Sufficiently Active (01/23/2023)   Exercise Vital Sign    Days of Exercise per Week: 6 days    Minutes of Exercise per Session: 120 min  Stress: No Stress Concern Present (01/23/2023)   [employer] of Occupational Health - Occupational Stress Questionnaire    Feeling of Stress : Only a little  Social Connections: Socially Isolated (01/23/2023)   Social Connection and Isolation Panel [NHANES]    Frequency of Communication with Friends and Family: More than three times a week    Frequency of Social Gatherings with Friends and Family: Three times a week    Attends Religious Services: Never    Active Member of Clubs or Organizations: No    Attends Engineer, structural: Not on file    Marital Status: Separated  Intimate Partner Violence: Not on file    Review of Systems  Constitutional:  Negative for chills and fever.  HENT:  Negative for ear pain and hearing loss.   Respiratory:  Negative for cough.   Skin:  Negative for itching and  rash.   Negative unless indicated in HPI   Objective    BP 125/84   Pulse 86   Temp 98.1 F (36.7 C) (Temporal)   Ht 5\' 11"  (1.803 m)   Wt 168 lb 12.8 oz (76.6 kg)   SpO2 98%   BMI 23.54 kg/m   Physical Exam Vitals and nursing note reviewed.  Constitutional:      Appearance: Normal appearance.  HENT:     Head: Normocephalic and atraumatic.  Eyes:     General: No scleral icterus.    Extraocular Movements: Extraocular movements intact.     Conjunctiva/sclera: Conjunctivae normal.     Pupils: Pupils are equal, round, and reactive to light.  Cardiovascular:     Rate and Rhythm: Normal rate and regular rhythm.  Pulmonary:     Effort: Pulmonary effort is normal.     Breath sounds: Normal breath sounds.  Musculoskeletal:        General: Normal range of motion.     Cervical back: Normal range of motion and neck supple.     Right lower leg: No edema.     Left lower leg: No edema.  Skin:    General: Skin is warm and dry.     Findings: Abscess present.     Comments: Mouth  Neurological:     Mental Status: He is alert and oriented to person, place, and time. Mental status is at baseline.  Psychiatric:        Mood and Affect: Mood normal.        Behavior: Behavior normal.        Thought Content: Thought content normal.        Judgment: Judgment normal.     Last CBC Lab Results  Component Value Date   WBC 4.3 11/26/2019   HGB 16.0 11/26/2019   HCT 47.7 11/26/2019   MCV 86 11/26/2019   MCH 28.7 11/26/2019   RDW 13.2 11/26/2019   PLT 275 11/26/2019   Last metabolic panel Lab Results  Component Value Date   GLUCOSE 99 11/26/2019   NA 137 11/26/2019   K 5.0 11/26/2019   CL 100 11/26/2019   CO2 25 11/26/2019   BUN 15 11/26/2019   CREATININE 0.85 11/26/2019   GFRNONAA 113 11/26/2019   CALCIUM 9.5 11/26/2019   PROT 7.6 11/26/2019   ALBUMIN 5.0 11/26/2019   LABGLOB 2.6 11/26/2019  AGRATIO 1.9 11/26/2019   BILITOT 0.4 11/26/2019   ALKPHOS 98 11/26/2019   AST  18 11/26/2019   ALT 24 11/26/2019   Last lipids Lab Results  Component Value Date   CHOL 262 (H) 11/26/2019   HDL 48 11/26/2019   LDLCALC 188 (H) 11/26/2019   TRIG 141 11/26/2019   CHOLHDL 5.5 (H) 11/26/2019   Last vitamin D Lab Results  Component Value Date   VD25OH 41.1 11/26/2019   Last vitamin B12 and Folate Lab Results  Component Value Date   VITAMINB12 440 11/26/2019   FOLATE >20.0 11/26/2019        Assessment & Plan:  Mild persistent asthma without complication -     Albuterol Sulfate HFA; Inhale 2 puffs into the lungs every 6 (six) hours as needed for wheezing or shortness of breath.  Dispense: 18 g; Refill: 0  Gastroesophageal reflux disease with esophagitis without hemorrhage -     Omeprazole; Take 1 capsule (20 mg total) by mouth daily.  Dispense: 90 capsule; Refill: 0  Encounter for general adult medical examination with abnormal findings -     CBC with Differential/Platelet -     CMP14+EGFR -     Lipid panel -     Thyroid Panel With TSH  Epigastric mass  Mouth ulcer -     lidocaine; Use as directed 15 mLs in the mouth or throat once for 1 dose.  Dispense: 15 mL; Refill: 0  GERD new diagnosis: Will start omeprazole 20 mg daily Avoid citrus food, caffeine, alcohol, chocolate, spicy food ingestion Discuss lifestyle modifications (e.g., weight management, avoiding trigger foods, elevating head of the bed, eating smaller meals, and not lying down immediately after eating).  Mouth ulcer: Viscous last with lidocaine as needed Asthma, well-controlled with albuterol inhaler refill provided Epigastric mass: Future will consider possible imaging if no improvement will wait PPI Asthma; Continue current asthma management plan, including inhaled corticosteroids and/or long-acting bronchodilators. Encourage ongoing monitoring of symptoms and peak flow readings, if applicable. Reassess at follow-up for any changes in symptoms or medication needs. Lab: CBC, CMP,  lipid, TSH  Encourage healthy lifestyle choices, including diet (rich in fruits, vegetables, and lean proteins, and low in salt and simple carbohydrates) and exercise (at least 30 minutes of moderate physical activity daily).     The above assessment and management plan was discussed with the patient. The patient verbalized understanding of and has agreed to the management plan. Patient is aware to call the clinic if they develop any new symptoms or if symptoms persist or worsen. Patient is aware when to return to the clinic for a follow-up visit. Patient educated on when it is appropriate to go to the emergency department.  Return in about 6 months (around 07/23/2023) for abdominal mass.   Arrie Aran Santa Lighter, DNP [facility] [address] [place], [place] [address] [phone]

## 2023-01-24 LAB — CBC WITH DIFFERENTIAL/PLATELET
Basophils Absolute: 0.1 10*3/uL (ref 0.0–0.2)
Basos: 1 %
EOS (ABSOLUTE): 0.3 10*3/uL (ref 0.0–0.4)
Eos: 4 %
Hematocrit: 46 % (ref 37.5–51.0)
Hemoglobin: 14.8 g/dL (ref 13.0–17.7)
Immature Grans (Abs): 0 10*3/uL (ref 0.0–0.1)
Immature Granulocytes: 0 %
Lymphocytes Absolute: 2.2 10*3/uL (ref 0.7–3.1)
Lymphs: 29 %
MCH: 28.5 pg (ref 26.6–33.0)
MCHC: 32.2 g/dL (ref 31.5–35.7)
MCV: 89 fL (ref 79–97)
Monocytes Absolute: 0.6 10*3/uL (ref 0.1–0.9)
Monocytes: 8 %
Neutrophils Absolute: 4.2 10*3/uL (ref 1.4–7.0)
Neutrophils: 58 %
Platelets: 294 10*3/uL (ref 150–450)
RBC: 5.2 x10E6/uL (ref 4.14–5.80)
RDW: 13.1 % (ref 11.6–15.4)
WBC: 7.4 10*3/uL (ref 3.4–10.8)

## 2023-01-24 LAB — CMP14+EGFR
ALT: 21 [IU]/L (ref 0–44)
AST: 22 [IU]/L (ref 0–40)
Albumin: 4.6 g/dL (ref 4.1–5.1)
Alkaline Phosphatase: 88 [IU]/L (ref 44–121)
BUN/Creatinine Ratio: 17 (ref 9–20)
BUN: 18 mg/dL (ref 6–20)
Bilirubin Total: 0.5 mg/dL (ref 0.0–1.2)
CO2: 25 mmol/L (ref 20–29)
Calcium: 9.2 mg/dL (ref 8.7–10.2)
Chloride: 102 mmol/L (ref 96–106)
Creatinine, Ser: 1.07 mg/dL (ref 0.76–1.27)
Globulin, Total: 2.5 g/dL (ref 1.5–4.5)
Glucose: 74 mg/dL (ref 70–99)
Potassium: 4.3 mmol/L (ref 3.5–5.2)
Sodium: 141 mmol/L (ref 134–144)
Total Protein: 7.1 g/dL (ref 6.0–8.5)
eGFR: 91 mL/min/{1.73_m2} (ref >59)

## 2023-01-24 LAB — LIPID PANEL
Chol/HDL Ratio: 3.4 ratio (ref 0.0–5.0)
Cholesterol, Total: 204 mg/dL — ABNORMAL HIGH (ref 100–199)
HDL: 60 mg/dL (ref >39)
LDL Chol Calc (NIH): 116 mg/dL — ABNORMAL HIGH (ref 0–99)
Triglycerides: 161 mg/dL — ABNORMAL HIGH (ref 0–149)
VLDL Cholesterol Cal: 28 mg/dL (ref 5–40)

## 2023-01-24 LAB — THYROID PANEL WITH TSH
Free Thyroxine Index: 1.7 (ref 1.2–4.9)
T3 Uptake Ratio: 28 % (ref 24–39)
T4, Total: 6 ug/dL (ref 4.5–12.0)
TSH: 0.782 u[IU]/mL (ref 0.450–4.500)

## 2023-02-05 ENCOUNTER — Other Ambulatory Visit: Payer: Self-pay | Admitting: Nurse Practitioner

## 2023-02-05 DIAGNOSIS — K121 Other forms of stomatitis: Secondary | ICD-10-CM

## 2023-02-05 MED ORDER — LIDOCAINE VISCOUS HCL 2 % MT SOLN: 15.0000 mL | OROMUCOSAL | 2 refills | Status: DC | PRN

## 2023-02-20 DIAGNOSIS — K219 Gastro-esophageal reflux disease without esophagitis: Secondary | ICD-10-CM | POA: Diagnosis not present

## 2023-02-20 DIAGNOSIS — J45909 Unspecified asthma, uncomplicated: Secondary | ICD-10-CM | POA: Diagnosis not present

## 2023-02-23 ENCOUNTER — Other Ambulatory Visit: Payer: Self-pay | Admitting: Family Medicine

## 2023-02-23 DIAGNOSIS — K121 Other forms of stomatitis: Secondary | ICD-10-CM

## 2023-02-23 MED ORDER — LIDOCAINE VISCOUS HCL 2 % MT SOLN: 15.0000 mL | OROMUCOSAL | 2 refills | Status: DC | PRN

## 2023-02-25 ENCOUNTER — Other Ambulatory Visit: Payer: Self-pay | Admitting: Nurse Practitioner

## 2023-02-25 DIAGNOSIS — J4530 Mild persistent asthma, uncomplicated: Secondary | ICD-10-CM

## 2023-03-24 ENCOUNTER — Other Ambulatory Visit: Payer: Self-pay | Admitting: Nurse Practitioner

## 2023-03-24 DIAGNOSIS — J4530 Mild persistent asthma, uncomplicated: Secondary | ICD-10-CM

## 2023-04-30 IMAGING — MR MR KNEE*R* W/O CM
4 of 6 series · 19 of 40 positions shown · non-contrast
Comparison: None.

CLINICAL DATA: Chronic right medial knee pain for 2 years, but has
increased for a year. No known injury. No history of surgery
reported.

EXAM:
MRI OF THE RIGHT KNEE WITHOUT CONTRAST
TECHNIQUE: Multiplanar, multisequence MR imaging of the knee was performed. No
intravenous contrast was administered.

[Series 3: T2 fat-sat · axial · 4.0mm · 0.29mm/px · z∈[-43,+36]mm · 3 of 28 slices shown (1 of 2)]
[im 5/28]
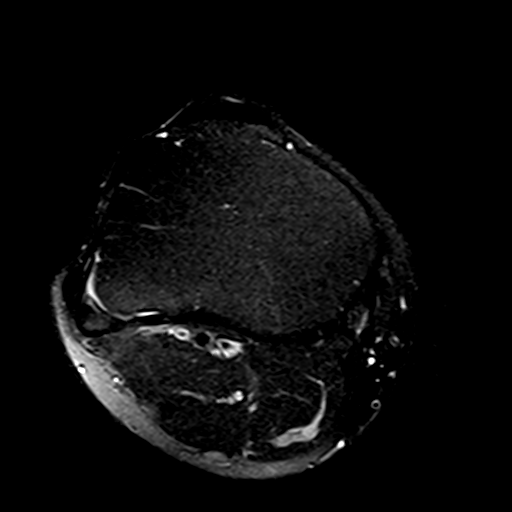
[im 14/28]
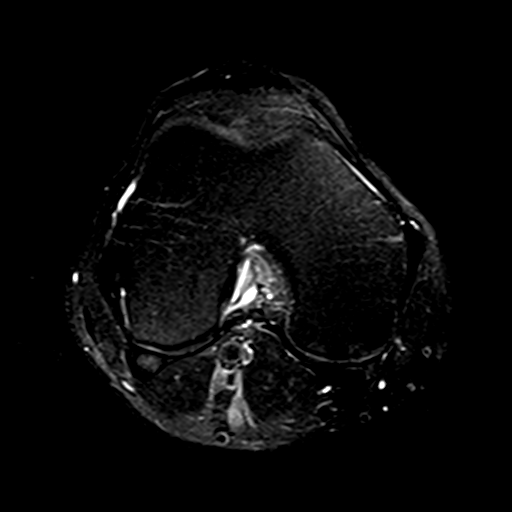
[im 23/28]
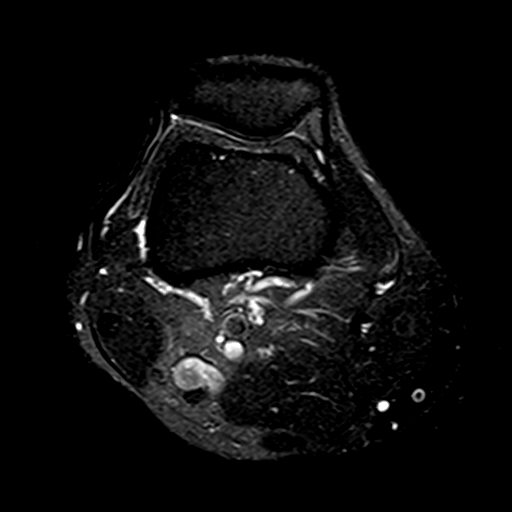

[Series 5: T2 fat-sat · coronal · 4.0mm · 0.29mm/px · 3 of 24 slices shown (2 of 2)]
[im 5/24]
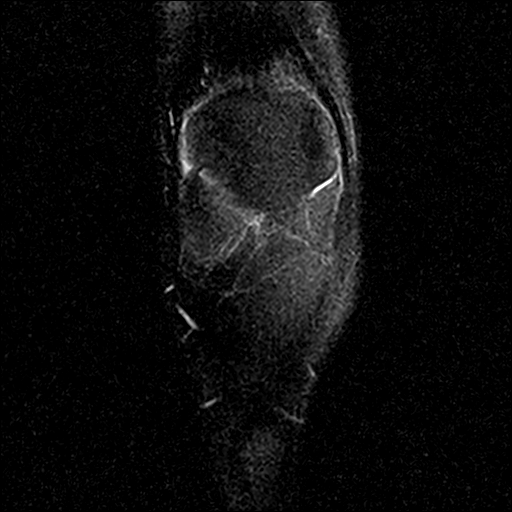
[im 14/24]
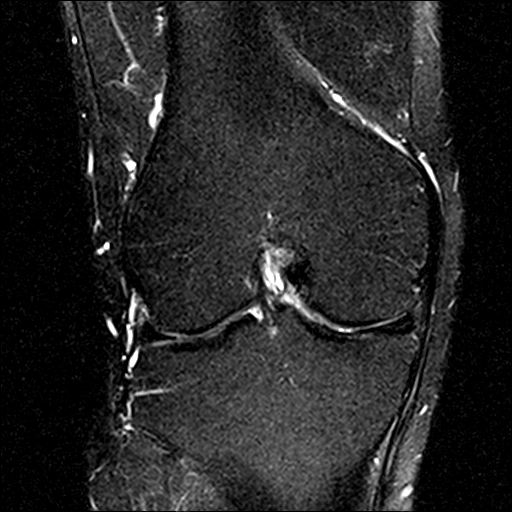
[im 24/24]
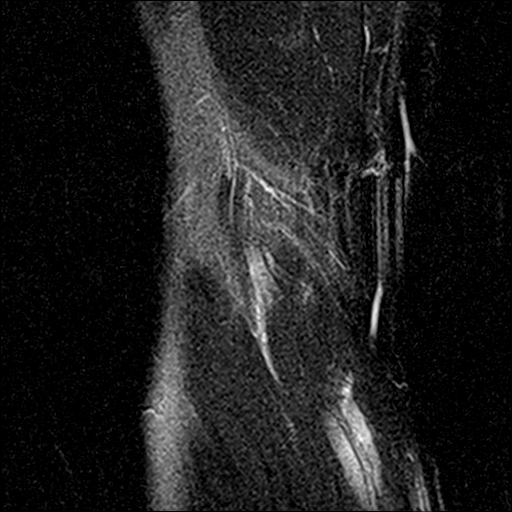

[Series 6: PD fat-sat · coronal · 3.0mm · 0.29mm/px · 7 of 29 slices shown (1 of 2)]
[im 1/29]
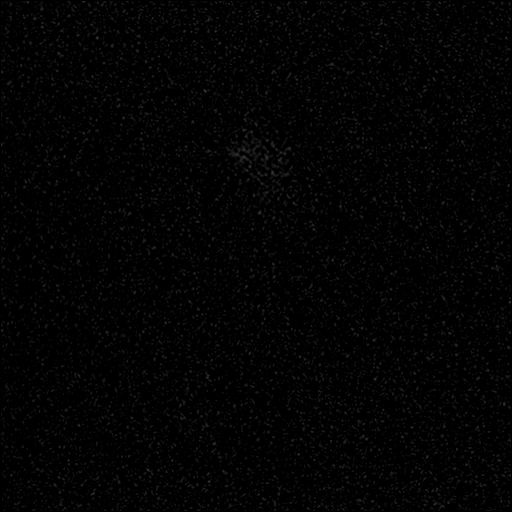
[im 5/29]
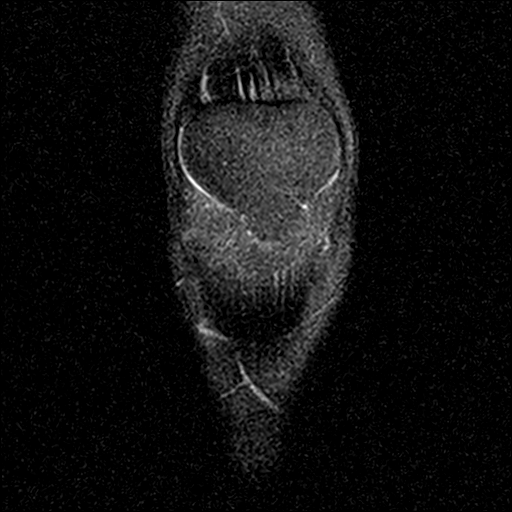
[im 10/29]
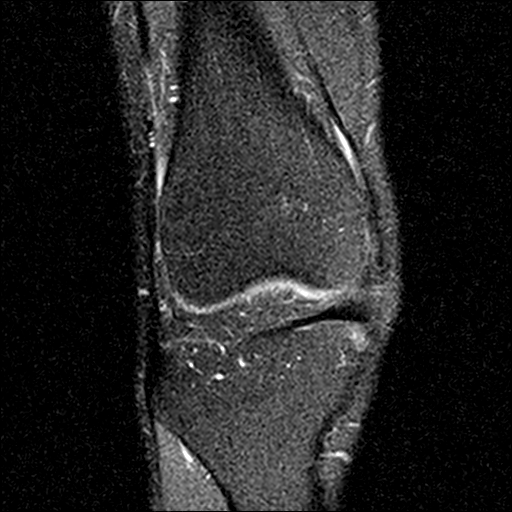
[im 15/29]
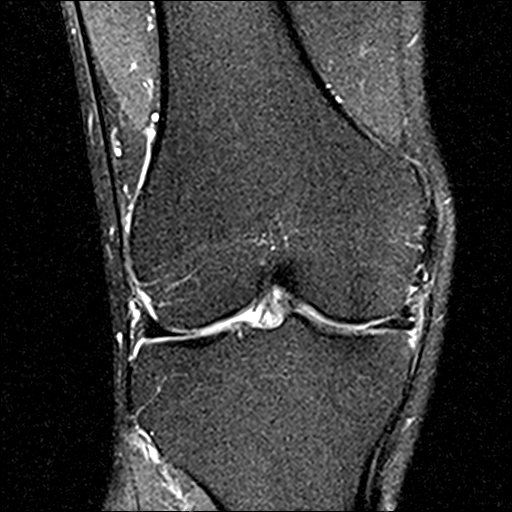
[im 19/29]
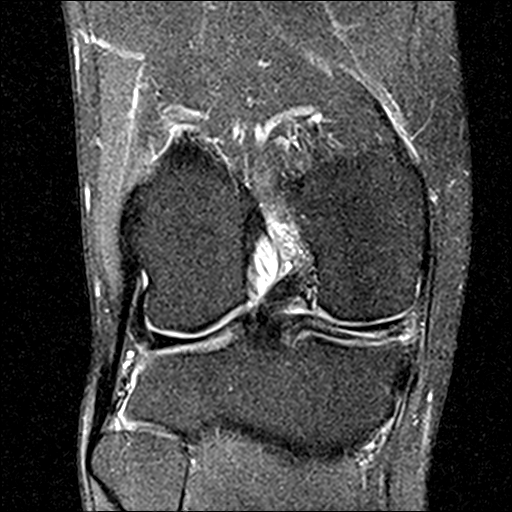
[im 24/29]
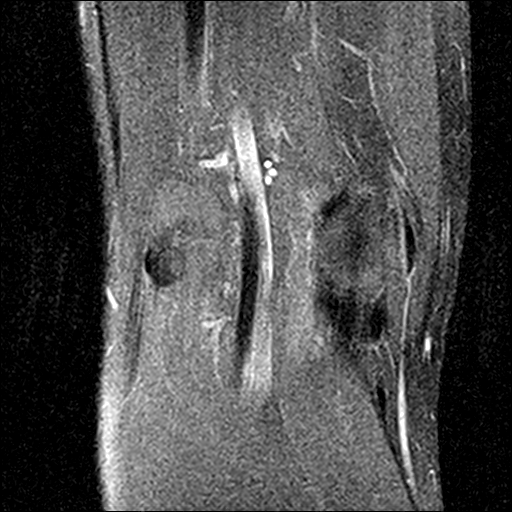
[im 29/29]
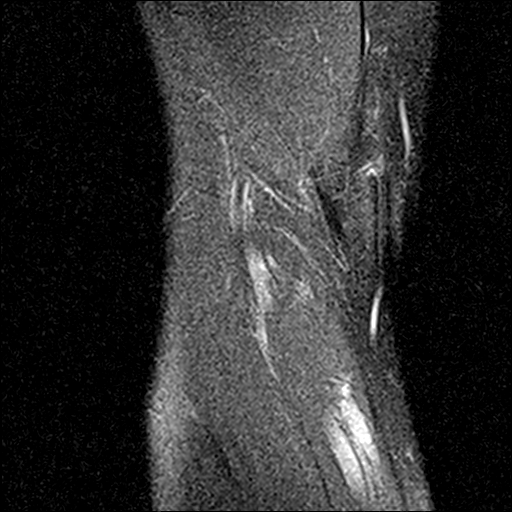

[Series 7: PD fat-sat · sagittal · 3.0mm · 0.29mm/px · 6 of 30 slices shown (2 of 2)]
[im 1/30]
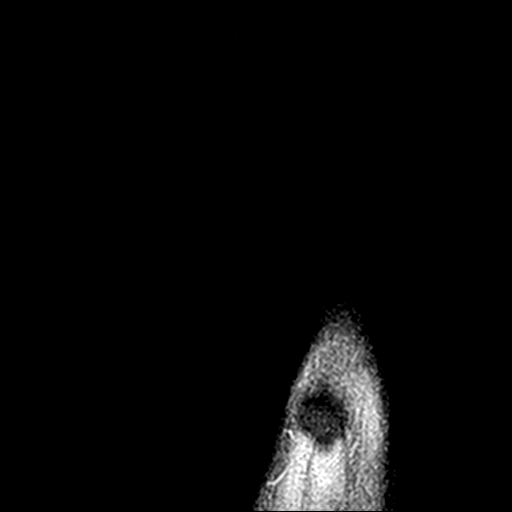
[im 5/30]
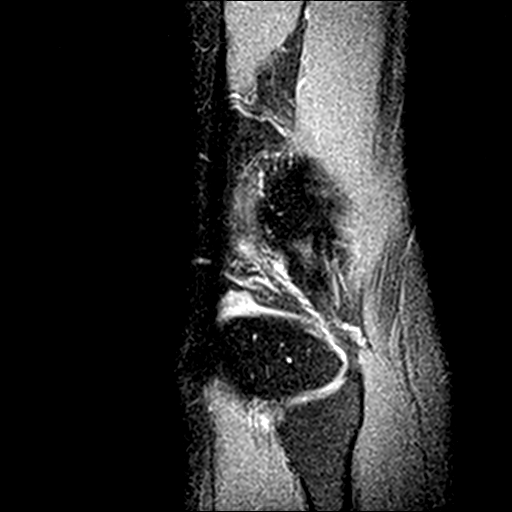
[im 10/30]
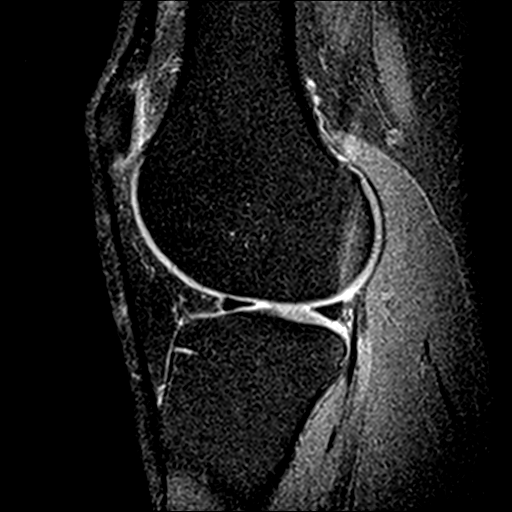
[im 15/30]
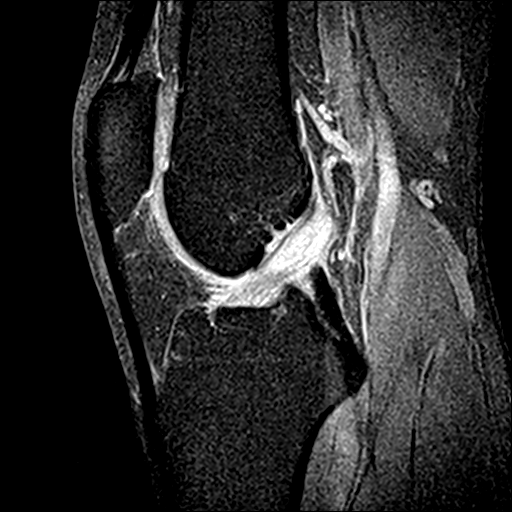
[im 20/30]
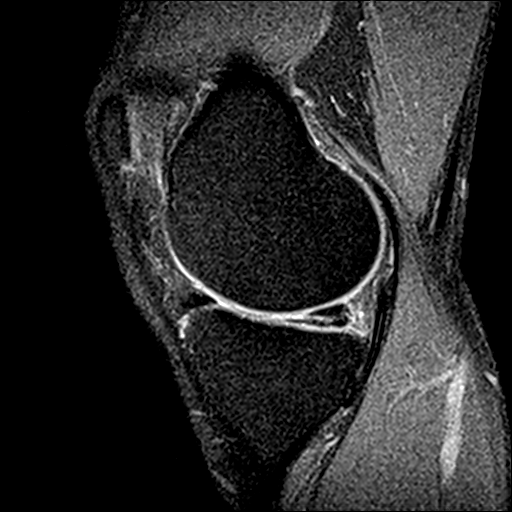
[im 25/30]
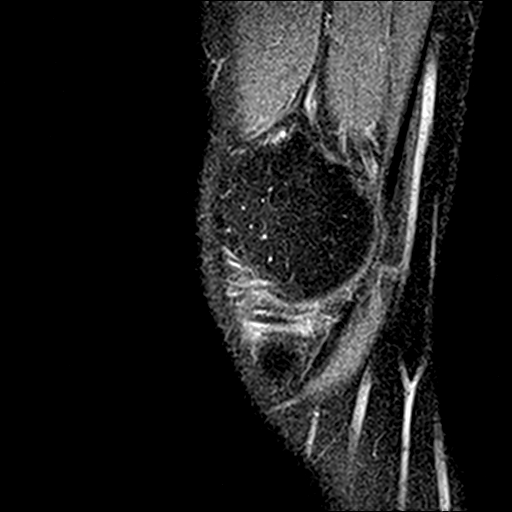

[19 of 40 positions shown; findings below may reference images not displayed]

FINDINGS: MENISCI

Medial meniscus: Complex tear of the posterior horn-body junction of
the medial meniscus with an oblique component extending into the
posterior horn and to the superior articular surface.

Lateral meniscus: Intact.

LIGAMENTS

Cruciates: Intact ACL and PCL. ACL is expanded and increased in
signal as can be seen with mucinous degeneration.

Collaterals: Medial collateral ligament is intact. Lateral
collateral ligament complex is intact.

CARTILAGE

Patellofemoral:  No chondral defect.

Medial: Mild partial-thickness cartilage loss of the medial
femorotibial compartment.

Lateral: No chondral defect.

Joint: No joint effusion. Normal Hoffa's fat. No plical thickening.

Popliteal Fossa: No Baker's cyst.  Intact popliteus tendon.

Extensor Mechanism: Intact quadriceps tendon and patellofemoral
tendon.

Bones: No focal marrow signal abnormality. No fracture or
dislocation.

Other: None.
IMPRESSION: 1. Complex tear of the posterior horn-body junction of the medial
meniscus with an oblique component extending into the posterior horn
and to the superior articular surface.

## 2023-07-17 ENCOUNTER — Encounter (HOSPITAL_COMMUNITY): Payer: Self-pay

## 2023-07-24 ENCOUNTER — Ambulatory Visit: Payer: 59 | Admitting: Nurse Practitioner

## 2023-07-24 ENCOUNTER — Encounter: Payer: Self-pay | Admitting: Nurse Practitioner

## 2023-07-24 ENCOUNTER — Other Ambulatory Visit: Payer: Self-pay | Admitting: Nurse Practitioner

## 2023-07-24 VITALS — BP 123/75 | HR 62 | Temp 97.2°F | Ht 71.0 in | Wt 171.0 lb

## 2023-07-24 DIAGNOSIS — K2100 Gastro-esophageal reflux disease with esophagitis, without bleeding: Secondary | ICD-10-CM

## 2023-07-24 DIAGNOSIS — K120 Recurrent oral aphthae: Secondary | ICD-10-CM | POA: Insufficient documentation

## 2023-07-24 DIAGNOSIS — R1906 Epigastric swelling, mass or lump: Secondary | ICD-10-CM | POA: Diagnosis not present

## 2023-07-24 DIAGNOSIS — J4530 Mild persistent asthma, uncomplicated: Secondary | ICD-10-CM | POA: Diagnosis not present

## 2023-07-24 MED ORDER — ALBUTEROL SULFATE HFA 108 (90 BASE) MCG/ACT IN AERS: 1.0000 | INHALATION_SPRAY | Freq: Four times a day (QID) | RESPIRATORY_TRACT | 2 refills | Status: AC | PRN

## 2023-07-24 MED ORDER — PANTOPRAZOLE SODIUM 20 MG PO TBEC: 20.0000 mg | DELAYED_RELEASE_TABLET | Freq: Every day | ORAL | 1 refills | Status: AC

## 2023-07-24 NOTE — Progress Notes (Signed)
 Established Patient Office Visit  Subjective  Patient ID: David Cobb, male    DOB: 31-Dec-1985  Age: 38 y.o. MRN: [id number]  Chief Complaint  Patient presents with   Medical Management of Chronic Issues    6 month     HPI David Cobb is a 38 year old male who presents on 07/24/2023 for a 55-month follow-up with concerns of recurrent mouth sores and a palpable epigastric mass. 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 At his last visit, he was prescribed viscous lidocaine , which provided only minimal relief. He expresses frustration, stating, "I've been having the sores for over 10 years now and no one can tell me why." The patient is concerned that the recurrent ulcers may be related to an autoimmune condition and is agreeable to further diagnostic work-up. Given the chronicity and pattern of symptoms, laboratory work-up is planned to rule out autoimmune and infectious etiologies, including Behet's disease and lupus.  Epigastric Mass The patient also reports a persistent epigastric mass that he describes as feeling like an "air bubble" in the area, especially noticeable after eating. He states that this has been an ongoing issue for several years and recalls that a referral to Gastroenterology (GI) was previously recommended but never completed. He is now requesting a GI referral for further evaluation and management.  Asthma: well-controlled asthma, managed with Symbicort and Ventolin . Report no recent asthma symptoms, with good control over their condition. Medication adherence is consistent, and there are no recent exacerbations or side effects   GERD, Follow up:  The patient was last seen for GERD 6 months ago. Changes made since that visit include omeprazole  20 mg dialy. He reports excellent compliance with treatment. He is not having side effects. David Cobb He IS experiencing early satiety, fullness after meals,  heartburn, and midespigastric pain. 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   Gastroesophageal reflux disease with esophagitis without hemorrhage 01/23/2023   Routine medical exam 01/23/2023   Encounter for general adult medical examination with abnormal findings 01/23/2023   Acute medial meniscal injury of knee, right, initial encounter 01/15/2021   Chronic pain of right knee 12/21/2020   Mouth ulcer 11/26/2019   Abdominal mass 11/26/2019   Encounter to establish care 11/26/2019   History of allergy 11/26/2019   Mild persistent asthma without complication 10/31/2019   Past Medical History:  Diagnosis Date   Allergy    seasonal as well as ASA and ibuprofen   Asthma    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   Hyperlipidemia    Past Surgical History:  Procedure Laterality Date   KNEE ARTHROSCOPY WITH MENISCAL REPAIR Right 01/27/2021   Procedure: RIGHT KNEE PARTIAL MEDIAL MENISCECTOMY;  Surgeon: Wes Hamman, MD;  Location: [facility];  Service: Orthopedics;  Laterality: Right;   NO PAST SURGERIES  11/2019   Social History   Tobacco Use   Smoking status: Never   Smokeless tobacco: Never  Vaping Use   Vaping status: Never Used  Substance Use Topics   Alcohol use: Yes    Comment: 2-3 times per week one or two drinks, beer and liquor   Drug use: Yes    Types: Marijuana   Social History  Socioeconomic History   Marital status: Single    Spouse name: Not on file   Number of children: Not on file   Years of education: Not on file   Highest education level: Associate degree: occupational, Scientist, product/process development, or vocational program  Occupational History   Occupation: Scientist, clinical (histocompatibility and immunogenetics)    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   Exercise - 15,000 steps daily at work, works at [employer].   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  11/2019.   Social Drivers of Corporate investment banker Strain: Low Risk  (07/23/2023)   Overall Financial Resource Strain (CARDIA)    Difficulty of Paying Living Expenses: Not hard at all  Food Insecurity: No Food Insecurity (07/23/2023)   Hunger Vital Sign    Worried About Running Out of Food in the Last Year: Never true    Ran Out of Food in the Last Year: Never true  Transportation Needs: No Transportation Needs (07/23/2023)   PRAPARE - Administrator, Civil Service (Medical): No    Lack of Transportation (Non-Medical): No  Physical Activity: Sufficiently Active (07/23/2023)   Exercise Vital Sign    Days of Exercise per Week: 6 days    Minutes of Exercise per Session: 60 min  Stress: No Stress Concern Present (07/23/2023)   [employer] of Occupational Health - Occupational Stress Questionnaire    Feeling of Stress : Only a little  Social Connections: Unknown (07/23/2023)   Social Connection and Isolation Panel [NHANES]    Frequency of Communication with Friends and Family: More than three times a week    Frequency of Social Gatherings with Friends and Family: Once a week    Attends Religious Services: Never    Database administrator or Organizations: No    Attends Engineer, structural: Not on file    Marital Status: Patient declined  Catering manager Violence: Not on file    Family Status  Relation Name Status   Mother  (Not Specified)   Father  (Not Specified)   Sister  Alive   Brother  Alive   PGF  (Not Specified)   Brother  Alive   Brother  Alive   Sister  Alive   Sister  Alive   Neg Hx  (Not Specified)  No partnership data on file   Family History  Problem Relation Age of Onset   Healthy Mother    Asthma Father    Healthy Sister    Asthma Brother    Diabetes Paternal Grandfather    Healthy Brother    Healthy Brother    Healthy Sister    Healthy Sister    Cancer Neg Hx    Stroke Neg Hx    Heart disease Neg Hx    Allergies  Allergen Reactions   Trazodone      Intolerance, felt awful on this      Review of Systems  Constitutional:  Negative for chills and fever.  HENT:  Negative for sore throat.   Respiratory:  Negative for cough, shortness of breath and wheezing.   Cardiovascular:  Negative for chest pain and leg swelling.  Gastrointestinal:  Negative for constipation, diarrhea, nausea and vomiting.  Skin:  Negative for itching and rash.       Mouth abcess  Neurological:  Negative for dizziness and headaches.   Negative unless indicated in HPI   Objective:     BP 123/75   Pulse 62   Temp (!) 97.2 F (36.2 C) (Temporal)   Ht 5\' 11"  (1.803 m)   Wt 171 lb (77.6 kg)   SpO2 99%   BMI 23.85 kg/m  BP Readings from Last 3 Encounters:  07/24/23 123/75  01/23/23 125/84  02/03/21 120/84   Wt Readings from Last 3 Encounters:  07/24/23 171 lb (77.6 kg)  01/23/23 168 lb 12.8 oz (76.6 kg)  02/03/21 163 lb (73.9 kg)      Physical Exam Vitals and nursing note reviewed.  Constitutional:      General: He is not in acute distress.    Appearance: Normal appearance.  HENT:     Head: Normocephalic and atraumatic.     Nose: Nose normal.     Mouth/Throat:     Mouth: Mucous membranes are moist.     Comments: Multiple small abscess lower jaw Eyes:     Conjunctiva/sclera: Conjunctivae normal.     Pupils: Pupils are equal,  round, and reactive to light.  Cardiovascular:     Heart sounds: Normal heart sounds.  Pulmonary:     Effort: Pulmonary effort is normal.     Breath sounds: Normal breath sounds.  Abdominal:     General: Bowel sounds are normal.     Palpations: Abdomen is soft. There is mass.     Tenderness: There is abdominal tenderness in the epigastric area. There is no right CVA tenderness, left CVA tenderness, guarding or rebound.     Hernia: No hernia is present.     Comments: Palpable mass  Musculoskeletal:        General: Normal range of motion.     Right lower leg: No edema.     Left lower leg: No edema.  Skin:    General: Skin is warm and dry.  Neurological:     Mental Status: He is alert and oriented to person, place, and time.  Psychiatric:        Mood and Affect: Mood normal.        Behavior: Behavior normal.        Thought Content: Thought content normal.        Judgment: Judgment normal.      Results for orders placed or performed in visit on 07/24/23  HSV DNA by PCR (reference lab)  Result Value Ref Range   HSV-1 DNA Negative Negative   HSV 2 DNA Negative Negative  Results for orders placed or performed in visit on 07/24/23  Sedimentation Rate  Result Value Ref Range   Sed Rate 3 0 - 15 mm/hr  HLA B*51 Disease Association  Result Value Ref Range   HLA-B WILL FOLLOW    HLA-B WILL FOLLOW    HLA Methodology WILL FOLLOW     Last CBC Lab Results  Component Value Date   WBC 7.4 01/23/2023   HGB 14.8 01/23/2023   HCT 46.0 01/23/2023   MCV 89 01/23/2023   MCH 28.5 01/23/2023   RDW 13.1 01/23/2023  PLT 294 01/23/2023   Last metabolic panel Lab Results  Component Value Date   GLUCOSE 74 01/23/2023   NA 141 01/23/2023   K 4.3 01/23/2023   CL 102 01/23/2023   CO2 25 01/23/2023   BUN 18 01/23/2023   CREATININE 1.07 01/23/2023   EGFR 91 01/23/2023   CALCIUM 9.2 01/23/2023   PROT 7.1 01/23/2023   ALBUMIN 4.6 01/23/2023   LABGLOB 2.5 01/23/2023   AGRATIO 1.9  11/26/2019   BILITOT 0.5 01/23/2023   ALKPHOS 88 01/23/2023   AST 22 01/23/2023   ALT 21 01/23/2023   Last lipids Lab Results  Component Value Date   CHOL 204 (H) 01/23/2023   HDL 60 01/23/2023   LDLCALC 116 (H) 01/23/2023   TRIG 161 (H) 01/23/2023   CHOLHDL 3.4 01/23/2023   Last hemoglobin A1c No results found for: "HGBA1C" Last thyroid  functions Lab Results  Component Value Date   TSH 0.782 01/23/2023   T4TOTAL 6.0 01/23/2023        Assessment & Plan:  Mild persistent asthma without complication -     Albuterol  Sulfate HFA; Inhale 1-2 puffs into the lungs every 6 (six) hours as needed for wheezing or shortness of breath.  Dispense: 18 each; Refill: 2  Epigastric mass -     Ambulatory referral to Gastroenterology  Gastroesophageal reflux disease with esophagitis without hemorrhage -     Pantoprazole  Sodium; Take 1 tablet (20 mg total) by mouth daily.  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     The above assessment and management plan was discussed with the patient. The patient verbalized understanding of and has agreed to the management  plan. Patient is aware to call the clinic if they develop any new symptoms or if symptoms persist or worsen. Patient is aware when to return to the clinic for a follow-up visit. Patient educated on when it is appropriate to go to the emergency department.  Return in about 6 months (around 01/24/2024).   Arlisha Patalano [place] Thompson, DNP [facility] [address] [place], [place] [address] [phone]    Note: This document was prepared by [employer] voice dictation technology and any errors that results from this process are unintentional.

## 2023-07-25 ENCOUNTER — Encounter (INDEPENDENT_AMBULATORY_CARE_PROVIDER_SITE_OTHER): Payer: Self-pay | Admitting: *Deleted

## 2023-07-26 LAB — HSV DNA BY PCR (REFERENCE LAB)
HSV 2 DNA: NEGATIVE
HSV-1 DNA: NEGATIVE

## 2023-07-28 LAB — HLA B*51 DISEASE ASSOCIATION

## 2023-07-28 LAB — SEDIMENTATION RATE: Sed Rate: 3 mm/h (ref 0–15)

## 2023-08-02 ENCOUNTER — Encounter (INDEPENDENT_AMBULATORY_CARE_PROVIDER_SITE_OTHER): Payer: Self-pay | Admitting: Gastroenterology

## 2023-08-02 ENCOUNTER — Ambulatory Visit (INDEPENDENT_AMBULATORY_CARE_PROVIDER_SITE_OTHER): Admitting: Gastroenterology

## 2023-08-02 VITALS — BP 102/68 | HR 73 | Temp 97.8°F | Ht 71.0 in | Wt 169.3 lb

## 2023-08-02 DIAGNOSIS — R1013 Epigastric pain: Secondary | ICD-10-CM | POA: Insufficient documentation

## 2023-08-02 DIAGNOSIS — K219 Gastro-esophageal reflux disease without esophagitis: Secondary | ICD-10-CM | POA: Insufficient documentation

## 2023-08-02 DIAGNOSIS — R109 Unspecified abdominal pain: Secondary | ICD-10-CM | POA: Diagnosis not present

## 2023-08-02 DIAGNOSIS — R1906 Epigastric swelling, mass or lump: Secondary | ICD-10-CM

## 2023-08-02 DIAGNOSIS — F1090 Alcohol use, unspecified, uncomplicated: Secondary | ICD-10-CM

## 2023-08-02 DIAGNOSIS — K121 Other forms of stomatitis: Secondary | ICD-10-CM | POA: Diagnosis not present

## 2023-08-02 NOTE — Patient Instructions (Signed)
 It was very nice to meet you today, as dicussed with will plan for the following :  1) Upper endoscopy  2) Ct abdomen and pelvis

## 2023-08-02 NOTE — H&P (View-Only) (Signed)
 Shahira Fiske Faizan Arieh Bogue , M.D. Gastroenterology & Hepatology [facility] [address] [place], [place] [address] Primary Care Physician: Villa Greaser, Adell Hones, NP [address] [place] [address]  Chief Complaint:  Abdominal pain , epigastric mass , oral ulcerations   History of Present Illness: David Cobb is a 38 y.o. male with asthma, seasonal allergies who presents for evaluation of  Abdominal pain , epigastric mass , oral ulcerations and GERD .  Patient has noticed epigastric protruding solid lesion for past 10 years .  His main concern remains epigastric pain which is worsening with food intake occasionally.  Patient reports also bubblelike feeling at the same area.  Reports NSAID use for back pain.  Patient reports he was told he had "stomach ulcers"  but never had an endoscopy The patient denies having any nausea, vomiting, fever, chills, hematochezia, melena, hematemesis, diarrhea, jaundice, pruritus or weight loss.  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   Hyperlipidemia     Past Surgical History: Past Surgical History:  Procedure Laterality Date   KNEE ARTHROSCOPY WITH MENISCAL REPAIR Right 01/27/2021   Procedure: RIGHT KNEE PARTIAL MEDIAL MENISCECTOMY;  Surgeon: Wes Hamman, MD;  Location: [facility];  Service: Orthopedics;  Laterality: Right;   NO PAST SURGERIES  11/2019    Family History: Family History  Problem Relation Age of Onset   Healthy Mother    Asthma Father     Healthy Sister    Asthma Brother    Diabetes Paternal Grandfather    Healthy Brother    Healthy Brother    Healthy Sister    Healthy Sister    Cancer Neg Hx    Stroke Neg Hx    Heart disease Neg Hx     Social History: Social History   Tobacco Use  Smoking Status Never  Smokeless Tobacco Never   Social History   Substance and Sexual Activity  Alcohol Use Yes   Comment: 2-3 times per week one or two drinks, beer and liquor   Social History   Substance and Sexual Activity  Drug Use Yes   Types: Marijuana   Comment: Rare    Allergies: Allergies  Allergen Reactions   Trazodone      Intolerance, felt awful on this    Medications: Current Outpatient Medications  Medication Sig Dispense Refill   albuterol  (VENTOLIN  HFA) 108 (90 Base) MCG/ACT inhaler Inhale 1-2 puffs into the lungs every 6 (six) hours as needed for wheezing or shortness of breath. 18 each 2   pantoprazole  (PROTONIX ) 20 MG tablet Take 1 tablet (20 mg total) by mouth daily. 90 tablet 1   No current facility-administered medications for this visit.    Review of Systems: GENERAL: negative for malaise, night sweats HEENT: No changes in hearing or vision, no nose bleeds or other nasal problems. NECK: Negative for lumps, goiter, pain and significant neck swelling RESPIRATORY: Negative for cough, wheezing CARDIOVASCULAR: Negative for chest pain, leg swelling, palpitations, orthopnea GI: SEE HPI MUSCULOSKELETAL: Negative for joint pain or swelling, back pain, and muscle  pain. SKIN: Negative for lesions, rash HEMATOLOGY Negative for prolonged bleeding, bruising easily, and swollen nodes. ENDOCRINE: Negative for cold or heat intolerance, polyuria, polydipsia and goiter. NEURO: negative for tremor, gait imbalance, syncope and seizures. The remainder of the review of systems is noncontributory.   Physical Exam: BP 102/68   Pulse 73   Temp 97.8 F (36.6 C)   Ht 5\' 11"  (1.803 m)   Wt 169 lb 4.8 oz (76.8  kg)   BMI 23.61 kg/m  GENERAL: The patient is AO x3, in no acute distress. HEENT: Head is normocephalic and atraumatic. EOMI are intact. Mouth is well hydrated and without lesions. NECK: Supple. No masses LUNGS: Clear to auscultation. No presence of rhonchi/wheezing/rales. Adequate chest expansion HEART: RRR, normal s1 and s2. ABDOMEN: Soft, nontender, no guarding, no peritoneal signs, and nondistended. BS +.  Solid protrusion at epigastric area , feels like a cartilage   Imaging/Labs: as above     Latest Ref Rng & Units 01/23/2023    4:16 PM 11/26/2019    1:45 PM  CBC  WBC 3.4 - 10.8 x10E3/uL 7.4  4.3   Hemoglobin 13.0 - 17.7 g/dL 82.9  56.2   Hematocrit 37.5 - 51.0 % 46.0  47.7   Platelets 150 - 450 x10E3/uL 294  275    No results found for: "IRON", "TIBC", "FERRITIN"  I personally reviewed and interpreted the available labs, imaging and endoscopic files.  Impression and Plan: David Cobb is a 38 y.o. male with asthma, seasonal allergies who presents for evaluation of  Abdominal pain , epigastric mass , oral ulcerations and GERD .  #Abdominal pain  #Epigastric solid lesion  #Oral ulceration  #GERD   This is a young patient with history of atopy and asthma with ongoing GERD not responsive to PPI, EOE is recommended to be ruled out with upper endoscopy with esophageal biopsies  Patient had tried omeprazole  20 to 40 mg for past hemorrhoids without resolution of his symptoms  Patient has postprandial abdominal pain in setting of NSAID use could be peptic ulcer disease which can be evaluated with upper endoscopy  Also on exam he does have a gastric protruding solid lesion which could be a cartilage and given chronicity likely a benign finding but will obtain baseline cross-sectional imaging to evaluate the etiology of this lesion  Given oral ulceration could be due to acid reflux I would recommend checking vitamin B12 ,folate with next blood work  CT abdomen pelvis with  IV contrast Upper endoscopy with esophageal biopsies  All questions were answered.      David Pflaum Faizan Audrea Bolte, MD Gastroenterology and Hepatology [facility]   This chart has been completed using [facility] Dictation software, and while attempts have been made to ensure accuracy , certain words and phrases may not be transcribed as intended

## 2023-08-02 NOTE — Progress Notes (Signed)
 Shahira Fiske Faizan Arieh Bogue , M.D. Gastroenterology & Hepatology [facility] [address] [place], [place] [address] Primary Care Physician: Villa Greaser, Adell Hones, NP [address] [place] [address]  Chief Complaint:  Abdominal pain , epigastric mass , oral ulcerations   History of Present Illness: David Cobb is a 38 y.o. male with asthma, seasonal allergies who presents for evaluation of  Abdominal pain , epigastric mass , oral ulcerations and GERD .  Patient has noticed epigastric protruding solid lesion for past 10 years .  His main concern remains epigastric pain which is worsening with food intake occasionally.  Patient reports also bubblelike feeling at the same area.  Reports NSAID use for back pain.  Patient reports he was told he had "stomach ulcers"  but never had an endoscopy The patient denies having any nausea, vomiting, fever, chills, hematochezia, melena, hematemesis, diarrhea, jaundice, pruritus or weight loss.  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   Hyperlipidemia     Past Surgical History: Past Surgical History:  Procedure Laterality Date   KNEE ARTHROSCOPY WITH MENISCAL REPAIR Right 01/27/2021   Procedure: RIGHT KNEE PARTIAL MEDIAL MENISCECTOMY;  Surgeon: Wes Hamman, MD;  Location: [facility];  Service: Orthopedics;  Laterality: Right;   NO PAST SURGERIES  11/2019    Family History: Family History  Problem Relation Age of Onset   Healthy Mother    Asthma Father     Healthy Sister    Asthma Brother    Diabetes Paternal Grandfather    Healthy Brother    Healthy Brother    Healthy Sister    Healthy Sister    Cancer Neg Hx    Stroke Neg Hx    Heart disease Neg Hx     Social History: Social History   Tobacco Use  Smoking Status Never  Smokeless Tobacco Never   Social History   Substance and Sexual Activity  Alcohol Use Yes   Comment: 2-3 times per week one or two drinks, beer and liquor   Social History   Substance and Sexual Activity  Drug Use Yes   Types: Marijuana   Comment: Rare    Allergies: Allergies  Allergen Reactions   Trazodone      Intolerance, felt awful on this    Medications: Current Outpatient Medications  Medication Sig Dispense Refill   albuterol  (VENTOLIN  HFA) 108 (90 Base) MCG/ACT inhaler Inhale 1-2 puffs into the lungs every 6 (six) hours as needed for wheezing or shortness of breath. 18 each 2   pantoprazole  (PROTONIX ) 20 MG tablet Take 1 tablet (20 mg total) by mouth daily. 90 tablet 1   No current facility-administered medications for this visit.    Review of Systems: GENERAL: negative for malaise, night sweats HEENT: No changes in hearing or vision, no nose bleeds or other nasal problems. NECK: Negative for lumps, goiter, pain and significant neck swelling RESPIRATORY: Negative for cough, wheezing CARDIOVASCULAR: Negative for chest pain, leg swelling, palpitations, orthopnea GI: SEE HPI MUSCULOSKELETAL: Negative for joint pain or swelling, back pain, and muscle  pain. SKIN: Negative for lesions, rash HEMATOLOGY Negative for prolonged bleeding, bruising easily, and swollen nodes. ENDOCRINE: Negative for cold or heat intolerance, polyuria, polydipsia and goiter. NEURO: negative for tremor, gait imbalance, syncope and seizures. The remainder of the review of systems is noncontributory.   Physical Exam: BP 102/68   Pulse 73   Temp 97.8 F (36.6 C)   Ht 5\' 11"  (1.803 m)   Wt 169 lb 4.8 oz (76.8  kg)   BMI 23.61 kg/m  GENERAL: The patient is AO x3, in no acute distress. HEENT: Head is normocephalic and atraumatic. EOMI are intact. Mouth is well hydrated and without lesions. NECK: Supple. No masses LUNGS: Clear to auscultation. No presence of rhonchi/wheezing/rales. Adequate chest expansion HEART: RRR, normal s1 and s2. ABDOMEN: Soft, nontender, no guarding, no peritoneal signs, and nondistended. BS +.  Solid protrusion at epigastric area , feels like a cartilage   Imaging/Labs: as above     Latest Ref Rng & Units 01/23/2023    4:16 PM 11/26/2019    1:45 PM  CBC  WBC 3.4 - 10.8 x10E3/uL 7.4  4.3   Hemoglobin 13.0 - 17.7 g/dL 82.9  56.2   Hematocrit 37.5 - 51.0 % 46.0  47.7   Platelets 150 - 450 x10E3/uL 294  275    No results found for: "IRON", "TIBC", "FERRITIN"  I personally reviewed and interpreted the available labs, imaging and endoscopic files.  Impression and Plan: David Cobb is a 38 y.o. male with asthma, seasonal allergies who presents for evaluation of  Abdominal pain , epigastric mass , oral ulcerations and GERD .  #Abdominal pain  #Epigastric solid lesion  #Oral ulceration  #GERD   This is a young patient with history of atopy and asthma with ongoing GERD not responsive to PPI, EOE is recommended to be ruled out with upper endoscopy with esophageal biopsies  Patient had tried omeprazole  20 to 40 mg for past hemorrhoids without resolution of his symptoms  Patient has postprandial abdominal pain in setting of NSAID use could be peptic ulcer disease which can be evaluated with upper endoscopy  Also on exam he does have a gastric protruding solid lesion which could be a cartilage and given chronicity likely a benign finding but will obtain baseline cross-sectional imaging to evaluate the etiology of this lesion  Given oral ulceration could be due to acid reflux I would recommend checking vitamin B12 ,folate with next blood work  CT abdomen pelvis with  IV contrast Upper endoscopy with esophageal biopsies  All questions were answered.      Mervyn Pflaum Faizan Audrea Bolte, MD Gastroenterology and Hepatology [facility]   This chart has been completed using [facility] Dictation software, and while attempts have been made to ensure accuracy , certain words and phrases may not be transcribed as intended

## 2023-08-03 ENCOUNTER — Encounter: Payer: Self-pay | Admitting: *Deleted

## 2023-08-03 ENCOUNTER — Telehealth (INDEPENDENT_AMBULATORY_CARE_PROVIDER_SITE_OTHER): Payer: Self-pay | Admitting: *Deleted

## 2023-08-03 NOTE — Telephone Encounter (Signed)
 Radmd PA :  Case Description:Abdomen and Pelvis CT Request ID:Not Available Tracking:[id number] Request Date: 08/03/2023 05:12 AM Status: In Review

## 2023-08-04 ENCOUNTER — Encounter: Payer: Self-pay | Admitting: *Deleted

## 2023-08-04 NOTE — Telephone Encounter (Signed)
 Radmd PA: Request ZO:[id number] Tracking: [id number] DOS: 08/03/23-09/02/23

## 2023-08-06 LAB — ANA 12 PLUS PROFILE, POSITIVE
Anti-CCP Ab, IgG & IgA (RDL): <20 U (ref <20)
Anti-Cardiolipin Ab, IgA (RDL): <12 U/mL (ref <12)
Anti-Cardiolipin Ab, IgG (RDL): <15 GPL U/mL (ref <15)
Anti-Cardiolipin Ab, IgM (RDL): <13 [MPL'U]/mL (ref <13)
Anti-Centromere Ab (RDL): <1:40 {titer}
Anti-Chromatin Ab, IgG (RDL): <20 U (ref <20)
Anti-La (SS-B) Ab (RDL): <20 U (ref <20)
Anti-Ro (SS-A) Ab (RDL): <20 U (ref <20)
Anti-Scl-70 Ab (RDL): <20 U (ref <20)
Anti-Sm Ab (RDL): <20 U (ref <20)
Anti-TPO Ab (RDL): <9 [IU]/mL (ref <9.0)
Anti-U1 RNP Ab (RDL): <20 U (ref <20)
Anti-dsDNA Ab by Farr(RDL): <8 [IU]/mL (ref <8.0)
C3 Complement (RDL): 188 mg/dL — ABNORMAL HIGH (ref 90–180)
C4 Complement (RDL): 41 mg/dL — ABNORMAL HIGH (ref 10–40)
Rheumatoid Factor by Turb RDL: <14 [IU]/mL (ref <14)
Speckled Pattern: 1:80 {titer} — ABNORMAL HIGH

## 2023-08-06 LAB — ANA 12 PLUS PROFILE (RDL): Anti-Nuclear Ab by IFA (RDL): POSITIVE — AB

## 2023-08-09 ENCOUNTER — Ambulatory Visit: Payer: Self-pay | Admitting: Family Medicine

## 2023-08-09 DIAGNOSIS — R768 Other specified abnormal immunological findings in serum: Secondary | ICD-10-CM

## 2023-08-29 ENCOUNTER — Encounter (HOSPITAL_COMMUNITY)
Admission: RE | Disposition: A | Discharge status: Home / Self Care | Payer: Self-pay | Source: Home / Self Care | Attending: Gastroenterology

## 2023-08-29 ENCOUNTER — Ambulatory Visit (HOSPITAL_BASED_OUTPATIENT_CLINIC_OR_DEPARTMENT_OTHER): Payer: Self-pay | Admitting: Anesthesiology

## 2023-08-29 ENCOUNTER — Ambulatory Visit (HOSPITAL_COMMUNITY): Payer: Self-pay | Admitting: Anesthesiology

## 2023-08-29 ENCOUNTER — Other Ambulatory Visit: Payer: Self-pay

## 2023-08-29 ENCOUNTER — Encounter (HOSPITAL_COMMUNITY): Payer: Self-pay | Admitting: Gastroenterology

## 2023-08-29 ENCOUNTER — Ambulatory Visit (HOSPITAL_COMMUNITY)
Admission: RE | Admit: 2023-08-29 | Discharge: 2023-08-29 | Disposition: A | Discharge status: Home / Self Care | Attending: Gastroenterology | Admitting: Gastroenterology

## 2023-08-29 DIAGNOSIS — R1013 Epigastric pain: Secondary | ICD-10-CM | POA: Insufficient documentation

## 2023-08-29 DIAGNOSIS — Z79899 Other long term (current) drug therapy: Secondary | ICD-10-CM | POA: Diagnosis not present

## 2023-08-29 DIAGNOSIS — K3189 Other diseases of stomach and duodenum: Secondary | ICD-10-CM | POA: Diagnosis not present

## 2023-08-29 DIAGNOSIS — M549 Dorsalgia, unspecified: Secondary | ICD-10-CM | POA: Diagnosis not present

## 2023-08-29 DIAGNOSIS — K2970 Gastritis, unspecified, without bleeding: Secondary | ICD-10-CM | POA: Diagnosis not present

## 2023-08-29 DIAGNOSIS — K121 Other forms of stomatitis: Secondary | ICD-10-CM | POA: Diagnosis not present

## 2023-08-29 DIAGNOSIS — J45909 Unspecified asthma, uncomplicated: Secondary | ICD-10-CM | POA: Insufficient documentation

## 2023-08-29 DIAGNOSIS — K219 Gastro-esophageal reflux disease without esophagitis: Secondary | ICD-10-CM | POA: Diagnosis not present

## 2023-08-29 DIAGNOSIS — I10 Essential (primary) hypertension: Secondary | ICD-10-CM | POA: Diagnosis not present

## 2023-08-29 DIAGNOSIS — Z791 Long term (current) use of non-steroidal anti-inflammatories (NSAID): Secondary | ICD-10-CM | POA: Insufficient documentation

## 2023-08-29 HISTORY — PX: ESOPHAGOGASTRODUODENOSCOPY: SHX5428

## 2023-08-29 SURGERY — EGD (ESOPHAGOGASTRODUODENOSCOPY)
Anesthesia: General

## 2023-08-29 MED ORDER — DEXMEDETOMIDINE HCL IN NACL 80 MCG/20ML IV SOLN
INTRAVENOUS | Status: DC | PRN
  Administered 2023-08-29: 4 ug via INTRAVENOUS

## 2023-08-29 MED ORDER — PHENYLEPHRINE 80 MCG/ML (10ML) SYRINGE FOR IV PUSH (FOR BLOOD PRESSURE SUPPORT)
PREFILLED_SYRINGE | INTRAVENOUS | Status: DC | PRN
Start: 2023-08-29 — End: 2023-08-29
  Administered 2023-08-29: 160 ug via INTRAVENOUS

## 2023-08-29 MED ORDER — LACTATED RINGERS IV SOLN: INTRAVENOUS | Status: DC

## 2023-08-29 MED ORDER — PROPOFOL 10 MG/ML IV BOLUS
INTRAVENOUS | Status: DC | PRN
  Administered 2023-08-29: 50 mg via INTRAVENOUS
  Administered 2023-08-29: 80 mg via INTRAVENOUS
  Administered 2023-08-29: 125 ug/kg/min via INTRAVENOUS

## 2023-08-29 MED ORDER — LIDOCAINE 2% (20 MG/ML) 5 ML SYRINGE
INTRAMUSCULAR | Status: DC | PRN
  Administered 2023-08-29: 60 mg via INTRAVENOUS

## 2023-08-29 NOTE — Interval H&P Note (Signed)
 History and Physical Interval Note:  08/29/2023 1:52 PM  David Cobb  has presented today for surgery, with the diagnosis of abdominal pain, peptic ulcer disease.  The various methods of treatment have been discussed with the patient and family. After consideration of risks, benefits and other options for treatment, the patient has consented to  Procedure(s) with comments: EGD (ESOPHAGOGASTRODUODENOSCOPY) (N/A) - 2:15 pm, asa 1/2 as a surgical intervention.  The patient's history has been reviewed, patient examined, no change in status, stable for surgery.  I have reviewed the patient's chart and labs.  Questions were answered to the patient's satisfaction.     Deatrice FALCON [name]

## 2023-08-29 NOTE — Discharge Instructions (Signed)

## 2023-08-29 NOTE — Anesthesia Preprocedure Evaluation (Signed)

## 2023-08-29 NOTE — Op Note (Signed)
 [facility] Patient Name: David Cobb Procedure Date: 08/29/2023 2:22 PM MRN: [id number] Date of Birth: August 20, 1985 Attending MD: Deatrice Dine , MD, [id number] CSN: [id number] Age: 38 Admit Type: Outpatient Procedure:                Upper GI endoscopy Indications:              Epigastric abdominal pain Providers:                Deatrice Dine, MD, Devere Lodge, Daphne Mulch                            Technician, Technician Referring MD:              Medicines:                Monitored Anesthesia Care Complications:            No immediate complications. Estimated Blood Loss:     Estimated blood loss was minimal. Procedure:                Pre-Anesthesia Assessment:                           - Prior to the procedure, a History and Physical                            was performed, and patient medications and                            allergies were reviewed. The patient's tolerance of                            previous anesthesia was also reviewed. The risks                            and benefits of the procedure and the sedation                            options and risks were discussed with the patient.                            All questions were answered, and informed consent                            was obtained. Prior Anticoagulants: The patient has                            taken no anticoagulant or antiplatelet agents. ASA                            Grade Assessment: II - A patient with mild systemic                            disease. After reviewing the risks and benefits,  the patient was deemed in satisfactory condition to                            undergo the procedure.                           After obtaining informed consent, the endoscope was                            passed under direct vision. Throughout the                            procedure, the patient's blood pressure, pulse, and                            oxygen  saturations were monitored continuously. The                            [id number]-[id number] ([id number]) scope was introduced through the                            mouth, and advanced to the second part of duodenum.                            The upper GI endoscopy was accomplished without                            difficulty. The patient tolerated the procedure                            well. Scope In: 2:35:58 PM Scope Out: 2:43:38 PM Total Procedure Duration: 0 hours 7 minutes 40 seconds  Findings:      There is no endoscopic evidence of esophagitis, stenosis or ulcerations       in the entire esophagus. Biopsies were obtained from the proximal and       distal esophagus with cold forceps for histology of suspected       eosinophilic esophagitis.      Esophagogastric landmarks were identified: the Z-line was found at 40 cm       and the site of the diaphragmatic hiatus was found at 40 cm from the       incisors.      Mild inflammation characterized by erosions was found in the stomach.       Biopsies were taken with a cold forceps for histology.      The duodenal bulb and second portion of the duodenum were normal. Impression:               - Esophagogastric landmarks identified.                           - Gastritis. Biopsied.                           - Normal duodenal bulb and second portion of the  duodenum.                           - Biopsies were taken with a cold forceps for                            evaluation of eosinophilic esophagitis. Moderate Sedation:      Per Anesthesia Care Recommendation:           - Patient has a contact number available for                            emergencies. The signs and symptoms of potential                            delayed complications were discussed with the                            patient. Return to normal activities tomorrow.                            Written discharge instructions were provided to the                             patient.                           - Resume previous diet.                           - Continue present medications.                           - Await pathology results.                           -Obtain CT as scheduled Procedure Code(s):        --- Professional ---                           [phone], Esophagogastroduodenoscopy, flexible,                            transoral; with biopsy, single or multiple Diagnosis Code(s):        --- Professional ---                           K29.70, Gastritis, unspecified, without bleeding                           R10.13, Epigastric pain CPT copyright 2022 American Medical Association. All rights reserved. The codes documented in this report are preliminary and upon coder review may  be revised to meet current compliance requirements. Deatrice Dine, MD Deatrice Dine, MD 08/29/2023 2:48:34 PM This report has been signed electronically. Number of Addenda: 0

## 2023-08-29 NOTE — Transfer of Care (Addendum)
 Immediate Anesthesia Transfer of Care Note  Patient: David Cobb  Procedure(s) Performed: EGD (ESOPHAGOGASTRODUODENOSCOPY)  Patient Location: Endoscopy Unit  Anesthesia Type:General  Level of Consciousness: drowsy and patient cooperative  Airway & Oxygen Therapy: Patient Spontanous Breathing  Post-op Assessment: Report given to RN and Post -op Vital signs reviewed and stable  Post vital signs: Reviewed and stable  Last Vitals:  Vitals Value Taken Time  BP 97/68 08/29/23   1445  Temp 36.6 08/29/23   1445  Pulse 82 08/29/23   1445  Resp 19 08/29/23   1445  SpO2 98% 08/29/23   1445    Last Pain:  Vitals:   08/29/23 1445  TempSrc: Axillary  PainSc: 0-No pain      Patients Stated Pain Goal: 8 (08/29/23 1321)  Complications: No notable events documented.

## 2023-08-29 NOTE — Anesthesia Procedure Notes (Signed)
 Date/Time: 08/29/2023 2:32 PM  Performed by: Para Jerelene CROME, CRNAOxygen Delivery Method: Simple face mask Comments: POM Face Mask.

## 2023-08-30 ENCOUNTER — Encounter (HOSPITAL_COMMUNITY): Payer: Self-pay | Admitting: Gastroenterology

## 2023-08-30 NOTE — Anesthesia Postprocedure Evaluation (Signed)
 Anesthesia Post Note  Patient: David Cobb  Procedure(s) Performed: EGD (ESOPHAGOGASTRODUODENOSCOPY)  Patient location during evaluation: Phase II Anesthesia Type: General Level of consciousness: awake Pain management: pain level controlled Vital Signs Assessment: post-procedure vital signs reviewed and stable Respiratory status: spontaneous breathing and respiratory function stable Cardiovascular status: blood pressure returned to baseline and stable Postop Assessment: no headache and no apparent nausea or vomiting Anesthetic complications: no Comments: Late entry   No notable events documented.   Last Vitals:  Vitals:   08/29/23 1321 08/29/23 1445  BP: 123/81 97/68  Pulse: 67 84  Resp: 16 19  Temp: 37.1 C 36.6 C  SpO2: 100% 98%    Last Pain:  Vitals:   08/29/23 1445  TempSrc: Axillary  PainSc: 0-No pain                 Yvonna JINNY Bosworth

## 2023-08-31 LAB — SURGICAL PATHOLOGY

## 2023-09-01 ENCOUNTER — Ambulatory Visit (INDEPENDENT_AMBULATORY_CARE_PROVIDER_SITE_OTHER): Payer: Self-pay | Admitting: Gastroenterology

## 2023-09-05 NOTE — Progress Notes (Signed)
 Patient result letter mailed Patient's PCP is on EPIC

## 2023-09-15 ENCOUNTER — Ambulatory Visit (HOSPITAL_COMMUNITY)
Admission: RE | Admit: 2023-09-15 | Discharge: 2023-09-15 | Disposition: A | Discharge status: Home / Self Care | Source: Ambulatory Visit | Attending: Gastroenterology

## 2023-09-15 ENCOUNTER — Encounter (HOSPITAL_COMMUNITY): Payer: Self-pay

## 2023-09-15 DIAGNOSIS — R1906 Epigastric swelling, mass or lump: Secondary | ICD-10-CM | POA: Diagnosis present

## 2023-09-15 MED ORDER — IOHEXOL 300 MG/ML  SOLN
100.0000 mL | Freq: Once | INTRAMUSCULAR | Status: AC | PRN
  Administered 2023-09-15: 100 mL via INTRAVENOUS

## 2023-09-17 ENCOUNTER — Ambulatory Visit (INDEPENDENT_AMBULATORY_CARE_PROVIDER_SITE_OTHER): Payer: Self-pay | Admitting: Gastroenterology

## 2023-09-17 NOTE — Progress Notes (Signed)
 CT abdomen and Pelvis  IMPRESSION: No acute findings. No evidence of epigastric mass or ventral hernia.   Incidental small benign hepatic hemangioma.

## 2023-11-02 ENCOUNTER — Ambulatory Visit (INDEPENDENT_AMBULATORY_CARE_PROVIDER_SITE_OTHER): Admitting: Gastroenterology

## 2024-01-23 ENCOUNTER — Ambulatory Visit: Payer: Self-pay | Admitting: Nurse Practitioner

## 2024-01-29 ENCOUNTER — Ambulatory Visit: Admitting: Nurse Practitioner

## 2024-02-07 ENCOUNTER — Encounter: Admitting: Internal Medicine

## 2024-02-07 NOTE — Progress Notes (Deleted)
 Office Visit Note  Patient: David Cobb             Date of Birth: 07-18-1985           MRN: [id number]             PCP: Deitra Morton Sebastian Nena, NP Referring: Dettinger, Fonda LABOR, MD Visit Date: 02/07/2024 Occupation: sales   Subjective:  No chief complaint on file.   History of Present Illness: David Cobb is a 38 y.o. male ***     Activities of Daily Living:  Patient reports morning stiffness for *** {minute/hour:19697}.   Patient {ACTIONS;DENIES/REPORTS:21021675::Denies} nocturnal pain.  Difficulty dressing/grooming: {ACTIONS;DENIES/REPORTS:21021675::Denies} Difficulty climbing stairs: {ACTIONS;DENIES/REPORTS:21021675::Denies} Difficulty getting out of chair: {ACTIONS;DENIES/REPORTS:21021675::Denies} Difficulty using hands for taps, buttons, cutlery, and/or writing: {ACTIONS;DENIES/REPORTS:21021675::Denies}  No Rheumatology ROS completed.   PMFS History:  Patient Active Problem List   Diagnosis Date Noted   Gastritis and gastroduodenitis 08/29/2023   Gastroesophageal reflux disease 08/02/2023   Epigastric pain 08/02/2023   Aphthous stomatitis 07/24/2023   Gastroesophageal reflux disease with esophagitis without hemorrhage 01/23/2023   Routine medical exam 01/23/2023   Encounter for general adult medical examination with abnormal findings 01/23/2023   Acute medial meniscal injury of knee, right, initial encounter 01/15/2021   Chronic pain of right knee 12/21/2020   Oral ulceration 11/26/2019   Abdominal mass 11/26/2019   Encounter to establish care 11/26/2019   History of allergy 11/26/2019   Mild persistent asthma without complication 10/31/2019    Past Medical History:  Diagnosis Date   Allergy    seasonal as well as ASA and ibuprofen   Asthma    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   Hyperlipidemia     Family History  Problem Relation Age of Onset   Healthy Mother    Asthma Father    Healthy Sister     Asthma Brother    Diabetes Paternal Actor    Healthy Brother    Healthy Brother    Healthy Sister    Healthy Sister    Cancer Neg Hx    Stroke Neg Hx    Heart disease Neg Hx    Past Surgical History:  Procedure Laterality Date   ESOPHAGOGASTRODUODENOSCOPY N/A 08/29/2023   Procedure: EGD (ESOPHAGOGASTRODUODENOSCOPY);  Surgeon: Cinderella Deatrice FALCON, MD;  [facility];  Service: Endoscopy;  Laterality: N/A;  2:15 pm, asa 1/2   KNEE ARTHROSCOPY WITH MENISCAL REPAIR Right 01/27/2021   Procedure: RIGHT KNEE PARTIAL MEDIAL MENISCECTOMY;  Surgeon: Jerri Kay HERO, MD;  Location: [facility];  Service: Orthopedics;  Laterality: Right;   NO PAST SURGERIES  11/2019   Social History   Tobacco Use   Smoking status: Never   Smokeless tobacco: Never  Vaping Use   Vaping status: Never Used  Substance Use Topics   Alcohol use: Yes    Comment: 2-3 times per week one or two drinks, beer and liquor   Drug use: Yes    Types: Marijuana    Comment: Rare   Social History   Social History Narrative   Lives with girlfriend, 1 daughter, some dogs.   Exercise - 15,000 steps daily at work, works at [employer].   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  11/2019.     Immunization History  Administered Date(s) Administered   Moderna Sars-Covid-2 Vaccination 11/18/2019, 01/06/2020   Tdap 01/24/2020     Objective: Vital Signs: There were no vitals taken for this visit.  Physical Exam   Musculoskeletal Exam: ***   Investigation: No additional findings.  Imaging: No results found.  Recent Labs: Lab Results  Component Value Date   WBC 7.4 01/23/2023   HGB 14.8 01/23/2023   PLT 294 01/23/2023   NA 141 01/23/2023   K 4.3 01/23/2023   CL 102 01/23/2023   CO2 25 01/23/2023   GLUCOSE 74 01/23/2023   BUN 18 01/23/2023   CREATININE 1.07 01/23/2023   BILITOT 0.5 01/23/2023   ALKPHOS 88 01/23/2023   AST 22 01/23/2023   ALT 21 01/23/2023   PROT  7.1 01/23/2023   ALBUMIN 4.6 01/23/2023   CALCIUM 9.2 01/23/2023   GFRAA 131 11/26/2019    Speciality Comments: No specialty comments available.  Procedures:  No procedures performed Allergies: Trazodone    Assessment / Plan:     Visit Diagnoses:  Assessment & Plan  ***  Follow-Up Instructions: No follow-ups on file.   Lonni LELON Ester, MD  Note - This record has been created using [employer].  Chart creation errors have been sought, but may not always  have been located. Such creation errors do not reflect on  the standard of medical care.

## 2024-02-12 NOTE — Progress Notes (Unsigned)
 Subjective:  Patient ID: David Cobb, male    DOB: [birth year], 38 y.o.   MRN: [id number]  Patient Care Team: Deitra Morton Sebastian Nena, NP as PCP - General (Nurse Practitioner)   Chief Complaint:  No chief complaint on file.   HPI: David Cobb is a 38 y.o. male presenting on 02/13/2024 for No chief complaint on file.   Discussed the use of AI scribe software for clinical note transcription with the patient, who gave verbal consent to proceed.  History of Present Illness       Relevant past medical, surgical, family, and social history reviewed and updated as indicated.  Allergies and medications reviewed and updated. Data reviewed: Chart in Epic.   Past Medical History:  Diagnosis Date   Allergy    seasonal as well as ASA and ibuprofen   Asthma    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   Hyperlipidemia     Past Surgical History:  Procedure Laterality Date   ESOPHAGOGASTRODUODENOSCOPY N/A 08/29/2023   Procedure: EGD (ESOPHAGOGASTRODUODENOSCOPY);  Surgeon: Cinderella Deatrice FALCON, MD;  Location: [facility];  Service: Endoscopy;  Laterality: N/A;  2:15 pm, asa 1/2   KNEE ARTHROSCOPY WITH MENISCAL REPAIR Right 01/27/2021   Procedure: RIGHT KNEE PARTIAL MEDIAL MENISCECTOMY;  Surgeon: Jerri Kay HERO, MD;  Location: [facility];  Service: Orthopedics;  Laterality: Right;   NO PAST SURGERIES  11/2019    Social History   Socioeconomic History   Marital status: Significant Other    Spouse name: Not on file   Number of children: Not on file   Years of education: Not on file   Highest education level: Associate degree: occupational, scientist, product/process development, or vocational program  Occupational History   Occupation: Scientist, Clinical (histocompatibility And Immunogenetics)    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   Exercise - 15,000 steps daily at work, works at [employer].   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  11/2019.   Social Drivers of Corporate Investment Banker Strain: Low Risk  (07/23/2023)   Overall Financial Resource Strain (CARDIA)    Difficulty of Paying Living Expenses: Not hard at all  Food Insecurity: No Food Insecurity (07/23/2023)   Hunger Vital Sign    Worried About Running Out of Food in the Last Year: Never true    Ran Out of Food in the Last Year: Never true  Transportation Needs: No Transportation Needs (07/23/2023)   PRAPARE - Administrator, Civil Service (Medical): No    Lack of Transportation (Non-Medical): No  Physical Activity: Sufficiently Active (07/23/2023)   Exercise Vital Sign    Days of Exercise per Week: 6 days    Minutes of Exercise per Session: 60 min  Stress: No Stress Concern Present (07/23/2023)   [employer] of Occupational Health - Occupational Stress Questionnaire    Feeling of Stress : Only a  little  Social Connections: Unknown (07/23/2023)   Social Connection and Isolation Panel    Frequency of Communication with Friends and Family: More than three times a week    Frequency of Social Gatherings with Friends and Family: Once a week    Attends Religious Services: Never    Database Administrator or Organizations: No    Attends Engineer, Structural: Not on file    Marital Status: Patient declined  Intimate Partner Violence: Not on file    Outpatient Encounter Medications as of 02/13/2024  Medication Sig   albuterol  (VENTOLIN  HFA) 108 (90 Base) MCG/ACT inhaler Inhale 1-2 puffs into the lungs every 6 (six) hours as needed for  wheezing or shortness of breath.   pantoprazole  (PROTONIX ) 20 MG tablet Take 1 tablet (20 mg total) by mouth daily.   No facility-administered encounter medications on file as of 02/13/2024.    Allergies  Allergen Reactions   Trazodone      Intolerance, felt awful on this    Pertinent ROS per HPI, otherwise unremarkable      Objective:  There were no vitals taken for this visit.   Wt Readings from Last 3 Encounters:  08/29/23 168 lb (76.2 kg)  08/02/23 169 lb 4.8 oz (76.8 kg)  07/24/23 171 lb (77.6 kg)    Physical Exam Physical Exam      Results for orders placed or performed during the hospital encounter of 08/29/23  Surgical pathology   Collection Time: 08/29/23  2:36 PM  Result Value Ref Range   SURGICAL PATHOLOGY      SURGICAL PATHOLOGY CASE: [id number] PATIENT: [name] Surgical Pathology Report     Clinical History: Abdominal pain, peptic ulcer disease (las)     FINAL MICROSCOPIC DIAGNOSIS:  A. GASTRIC, BIOPSY: Gastric antral and oxyntic mucosa with reactive gastropathy. Negative for Helicobacter pylori.  B. ESOPHAGEAL, BIOPSY: Unremarkable squamous mucosa. Negative for eosinophilic esophagitis.  GROSS DESCRIPTION: A.  Received in formalin are tan, soft tissue fragments that are submitted in toto. Number: 3 size: 0.2 to 0.5 cm blocks: 1  B.  Received in formalin are tan, soft tissue fragments that are submitted in toto. Number: 3 size: 0.2 to 0.3 cm blocks: 1 (KW, 08/30/2023)    Final Diagnosis performed by Norleen Dover, MD.   Electronically signed 08/31/2023 Technical component performed at [facility], [address], [place], [place] [address].  Professional component performed at [employer]. [facility], [address], [place], [place] [address].  Immunohistochemistry Technical component (if applicable) was performed at [facility]. [address], [place], [place]  [address].   IMMUNOHISTOCHEMISTRY DISCLAIMER (if applicable): Some of these immunohistochemical stains may have been developed and the performance characteristics determine by [facility]. Some may not have been cleared or approved by the U.S. Food and Drug Administration. The FDA has determined that such clearance or approval is not necessary. This test is used for clinical purposes. It should not be regarded as investigational or for research. This laboratory is certified under the Clinical Laboratory Improvement Amendments of 1988 (CLIA-88) as qualified to perform high complexity clinical laboratory testing.  The controls stained appropriately.   IHC stains are performed on formalin fixed, paraffin embedded tissue using a 3,3diaminobenzidine (DAB) chromogen and Leica Bond Autostainer  System. The staining intensity of the nucleus is score manually and is reported as the percentage of tumor cell nuclei demonstrating specific nuclear staining. The specimens are fixed in 10%  Neutral Formalin for at least 6 hours and up to 72hrs. These tests are validated on decalcified tissue. Results should be interpreted with caution given the possibility of false negative results on decalcified specimens. Antibody Clones are as follows ER-clone [id number], PR-clone 16, Ki67- clone MM1. Some of these immunohistochemical stains may have been developed and the performance characteristics determined by [facility].        Pertinent labs & imaging results that were available during my care of the patient were reviewed by me and considered in my medical decision making.  Assessment & Plan:  There are no diagnoses linked to this encounter.   Assessment and Plan Assessment & Plan       Continue all other maintenance medications.  Follow up plan: No follow-ups on file.   Continue healthy lifestyle choices, including diet (rich in fruits, vegetables, and lean proteins, and low in salt  and simple carbohydrates) and exercise (at least 30 minutes of moderate physical activity daily).  Educational handout given for ***  The above assessment and management plan was discussed with the patient. The patient verbalized understanding of and has agreed to the management plan. Patient is aware to call the clinic if they develop any new symptoms or if symptoms persist or worsen. Patient is aware when to return to the clinic for a follow-up visit. Patient educated on when it is appropriate to go to the emergency department.   Margarite Vessel [place] Thompson, DNP [facility] [address] [place], [place] [address] [phone]

## 2024-02-13 ENCOUNTER — Ambulatory Visit: Admitting: Nurse Practitioner
